# Patient Record
Sex: Female | Born: 1950 | Race: White | Hispanic: No | Marital: Married | Smoking: Never smoker
Health system: Southern US, Community
[De-identification: ages and names within clinical notes are randomized; demographics above are authoritative.]

## PROBLEM LIST (undated history)

## (undated) DIAGNOSIS — E785 Hyperlipidemia, unspecified: Secondary | ICD-10-CM

## (undated) DIAGNOSIS — I10 Essential (primary) hypertension: Secondary | ICD-10-CM

## (undated) DIAGNOSIS — G5603 Carpal tunnel syndrome, bilateral upper limbs: Secondary | ICD-10-CM

## (undated) HISTORY — DX: Essential (primary) hypertension: I10

## (undated) HISTORY — DX: Hyperlipidemia, unspecified: E78.5

## (undated) HISTORY — PX: BREAST BIOPSY: SHX20

## (undated) HISTORY — DX: Carpal tunnel syndrome, bilateral upper limbs: G56.03

---

## 1998-07-20 ENCOUNTER — Encounter: Admission: RE | Admit: 1998-07-20 | Discharge: 1998-07-20 | Payer: Self-pay | Admitting: Family Medicine

## 2000-06-04 ENCOUNTER — Encounter: Admission: RE | Admit: 2000-06-04 | Discharge: 2000-06-04 | Payer: Self-pay | Admitting: Family Medicine

## 2000-06-08 ENCOUNTER — Encounter: Admission: RE | Admit: 2000-06-08 | Discharge: 2000-06-08 | Payer: Self-pay | Admitting: Family Medicine

## 2000-06-08 ENCOUNTER — Encounter: Admission: RE | Admit: 2000-06-08 | Discharge: 2000-06-08 | Payer: Self-pay | Admitting: Sports Medicine

## 2000-06-08 ENCOUNTER — Encounter: Payer: Self-pay | Admitting: Family Medicine

## 2000-06-15 ENCOUNTER — Encounter: Admission: RE | Admit: 2000-06-15 | Discharge: 2000-06-15 | Payer: Self-pay | Admitting: Family Medicine

## 2003-08-08 ENCOUNTER — Other Ambulatory Visit: Admission: RE | Admit: 2003-08-08 | Discharge: 2003-08-08 | Payer: Self-pay | Admitting: Internal Medicine

## 2003-08-16 ENCOUNTER — Encounter: Admission: RE | Admit: 2003-08-16 | Discharge: 2003-08-16 | Payer: Self-pay | Admitting: Internal Medicine

## 2003-10-12 ENCOUNTER — Encounter: Admission: RE | Admit: 2003-10-12 | Discharge: 2003-10-12 | Payer: Self-pay | Admitting: Internal Medicine

## 2004-08-27 ENCOUNTER — Encounter: Admission: RE | Admit: 2004-08-27 | Discharge: 2004-08-27 | Payer: Self-pay | Admitting: Internal Medicine

## 2004-11-04 ENCOUNTER — Other Ambulatory Visit: Admission: RE | Admit: 2004-11-04 | Discharge: 2004-11-04 | Payer: Self-pay | Admitting: Internal Medicine

## 2005-09-09 ENCOUNTER — Encounter: Admission: RE | Admit: 2005-09-09 | Discharge: 2005-09-09 | Payer: Self-pay | Admitting: Internal Medicine

## 2005-12-16 ENCOUNTER — Other Ambulatory Visit: Admission: RE | Admit: 2005-12-16 | Discharge: 2005-12-16 | Payer: Self-pay | Admitting: Internal Medicine

## 2006-10-22 ENCOUNTER — Encounter: Admission: RE | Admit: 2006-10-22 | Discharge: 2006-10-22 | Payer: Self-pay | Admitting: Internal Medicine

## 2007-11-02 ENCOUNTER — Encounter: Admission: RE | Admit: 2007-11-02 | Discharge: 2007-11-02 | Payer: Self-pay | Admitting: Internal Medicine

## 2008-04-27 ENCOUNTER — Ambulatory Visit: Payer: Self-pay | Admitting: Internal Medicine

## 2008-04-27 ENCOUNTER — Other Ambulatory Visit: Admission: RE | Admit: 2008-04-27 | Discharge: 2008-04-27 | Payer: Self-pay | Admitting: Internal Medicine

## 2008-11-09 ENCOUNTER — Encounter: Admission: RE | Admit: 2008-11-09 | Discharge: 2008-11-09 | Payer: Self-pay | Admitting: Internal Medicine

## 2008-11-23 ENCOUNTER — Ambulatory Visit: Payer: Self-pay | Admitting: Internal Medicine

## 2009-05-22 ENCOUNTER — Ambulatory Visit: Payer: Self-pay | Admitting: Internal Medicine

## 2009-05-22 ENCOUNTER — Other Ambulatory Visit
Admission: RE | Admit: 2009-05-22 | Discharge: 2009-05-22 | Payer: Self-pay | Source: Home / Self Care | Admitting: Internal Medicine

## 2009-05-22 LAB — HM PAP SMEAR

## 2009-06-19 ENCOUNTER — Ambulatory Visit: Payer: Self-pay | Admitting: Sports Medicine

## 2009-06-19 DIAGNOSIS — G5601 Carpal tunnel syndrome, right upper limb: Secondary | ICD-10-CM | POA: Insufficient documentation

## 2009-06-19 DIAGNOSIS — M2020 Hallux rigidus, unspecified foot: Secondary | ICD-10-CM | POA: Insufficient documentation

## 2009-06-19 DIAGNOSIS — M79609 Pain in unspecified limb: Secondary | ICD-10-CM | POA: Insufficient documentation

## 2009-08-16 ENCOUNTER — Ambulatory Visit: Payer: Self-pay | Admitting: Internal Medicine

## 2009-11-14 ENCOUNTER — Encounter: Admission: RE | Admit: 2009-11-14 | Discharge: 2009-11-14 | Payer: Self-pay | Admitting: Internal Medicine

## 2009-11-20 ENCOUNTER — Ambulatory Visit: Payer: Self-pay | Admitting: Internal Medicine

## 2010-05-14 NOTE — Assessment & Plan Note (Signed)
Summary: 4PM APPT,FOOT INJURY PER Gwendolyn Herman,MC   Vital Signs:  Patient profile:   60 year old female Height:      62 inches Weight:      170 pounds BMI:     31.21 BP sitting:   155 / 92  Vitals Entered By: Lillia Pauls CMA (June 19, 2009 4:28 PM)  CC:  left foot pain, left great toe pain, and carpal tunnel.  History of Present Illness: 1.  left foot pain--after tripping over husband's shoe at the end of the stairs 2 days ago.  everted foot.  pain since that time.  some brusing that she just noticed today.  has been icing and elevating.  painful, but can bear weight.  pain is over dorsal surface of foot over metatarsals.\par  2.  left great toe pain--toe is stiff, painful, decreased rom.  no swelling or numbness  3.  bilateral carpal tunnel--has a splint for the right, but not for the left.    Review of Systems General:  Denies weakness. MS:  Denies joint redness, loss of strength, and muscle weakness.  Physical Exam  General:  Well-developed,well-nourished,in no acute distress; alert,appropriate and cooperative throughout examination Msk:  left foot:  moderate swelling and bruising dorsal foot over the metatarsals.  ttp over the 2nd metatarsal.  no bony abnormalities on palpation.  normal rom of ankle.  no laxity of ankle ligaments.    u/s of left dorsal foot:  2nd-5th metatarsals with no visible fracture.  increase blood flow.  images saved  left great toe:  flexion to about 30 degrees.  extension to 20 degrees.  no bony spurs palpated.   Impression & Recommendations:  Problem # 1:  FOOT PAIN (ICD-729.5) Assessment New forefoot strain.  no fx seen on u/s.  RICE, cast shoe, foot strap to prevent mobility.  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  given splint for  the right.  start vitamin B 6  Orders: Brace Wrist ([id number])  Patient Instructions: 1)  It was nice to see you today. 2)  Wear the foot strap and the shoe that we gave you for the next week. 3)  Keep icing your foot.  Elevate it when you can. 4)  If you are feeling better in one week, you can start wearing a regular shoe, but keep wearing the foot strap. 5)  It is OK to bike, but don't do a lot of walking until you are feeling better. 6)  For your toe, use the anti-inflammatory gel Dr Darrick Penna prescribed you. 7)  For your wrists, wear the splints at night and start taking Vitamin B 6. 8)  Please schedule a follow-up appointment if you are not getting better.

## 2010-05-21 ENCOUNTER — Other Ambulatory Visit: Payer: Self-pay | Admitting: Internal Medicine

## 2010-05-23 ENCOUNTER — Encounter (INDEPENDENT_AMBULATORY_CARE_PROVIDER_SITE_OTHER): Payer: BC Managed Care – PPO | Admitting: Internal Medicine

## 2010-05-23 DIAGNOSIS — E785 Hyperlipidemia, unspecified: Secondary | ICD-10-CM

## 2010-05-23 DIAGNOSIS — I10 Essential (primary) hypertension: Secondary | ICD-10-CM

## 2010-09-25 ENCOUNTER — Other Ambulatory Visit: Payer: Self-pay

## 2010-09-25 MED ORDER — ATORVASTATIN CALCIUM 40 MG PO TABS: 40.0000 mg | ORAL_TABLET | Freq: Every day | ORAL | Status: DC

## 2010-10-23 ENCOUNTER — Other Ambulatory Visit: Payer: Self-pay | Admitting: Internal Medicine

## 2010-10-23 DIAGNOSIS — Z1231 Encounter for screening mammogram for malignant neoplasm of breast: Secondary | ICD-10-CM

## 2010-11-11 ENCOUNTER — Telehealth: Payer: Self-pay

## 2010-11-12 NOTE — Telephone Encounter (Signed)
Patient needed to change an appointment

## 2010-11-18 ENCOUNTER — Encounter: Payer: Self-pay | Admitting: Internal Medicine

## 2010-11-18 ENCOUNTER — Ambulatory Visit
Admission: RE | Admit: 2010-11-18 | Discharge: 2010-11-18 | Disposition: A | Discharge status: Home / Self Care | Payer: BC Managed Care – PPO | Source: Ambulatory Visit | Attending: Internal Medicine | Admitting: Internal Medicine

## 2010-11-18 DIAGNOSIS — Z1231 Encounter for screening mammogram for malignant neoplasm of breast: Secondary | ICD-10-CM

## 2010-11-19 ENCOUNTER — Other Ambulatory Visit: Payer: BC Managed Care – PPO | Admitting: Internal Medicine

## 2010-11-19 DIAGNOSIS — E785 Hyperlipidemia, unspecified: Secondary | ICD-10-CM

## 2010-11-19 LAB — LIPID PANEL
HDL: 75 mg/dL (ref >39)
Triglycerides: 82 mg/dL (ref <150)

## 2010-11-19 LAB — HEPATIC FUNCTION PANEL
ALT: 22 U/L (ref 0–35)
Albumin: 4.1 g/dL (ref 3.5–5.2)
Alkaline Phosphatase: 59 U/L (ref 39–117)
Total Bilirubin: 0.7 mg/dL (ref 0.3–1.2)

## 2010-11-21 ENCOUNTER — Ambulatory Visit (INDEPENDENT_AMBULATORY_CARE_PROVIDER_SITE_OTHER): Payer: BC Managed Care – PPO | Admitting: Internal Medicine

## 2010-11-21 ENCOUNTER — Encounter: Payer: Self-pay | Admitting: Internal Medicine

## 2010-11-21 ENCOUNTER — Other Ambulatory Visit: Payer: BC Managed Care – PPO | Admitting: Internal Medicine

## 2010-11-21 VITALS — BP 132/78 | HR 72 | Temp 98.7°F | Ht 62.0 in | Wt 159.0 lb

## 2010-11-21 DIAGNOSIS — E785 Hyperlipidemia, unspecified: Secondary | ICD-10-CM | POA: Insufficient documentation

## 2010-11-21 DIAGNOSIS — I10 Essential (primary) hypertension: Secondary | ICD-10-CM | POA: Insufficient documentation

## 2010-11-21 NOTE — Progress Notes (Signed)
  Subjective:    Patient ID: Gwendolyn Herman, female    DOB: December 05, 1950, 60 y.o.   MRN: [id number]  HPI  60 year old white female with history of hypertension and hyperlipidemia in today for six-month recheck. She is on Cozaar 50 mg daily and Lipitor 40 mg daily. She's been walking at least 4 miles daily. In February she weighed 176 pounds and now weighs 159 pounds. Had mammogram recently which was normal.    Review of Systems     Objective:   Physical Exam  chest clear to auscultation; cardiac exam regular rate and rhythm, normal S1 and S2; extremities without edema.        Assessment & Plan:  Hypertension  Hyperlipidemia-recent lipid panel liver functions entirely within normal limits on Lipitor 40 mg daily  Return in 6 months for physical examination.

## 2011-03-17 ENCOUNTER — Encounter: Payer: Self-pay | Admitting: Internal Medicine

## 2011-03-17 ENCOUNTER — Ambulatory Visit (INDEPENDENT_AMBULATORY_CARE_PROVIDER_SITE_OTHER): Payer: BC Managed Care – PPO | Admitting: Internal Medicine

## 2011-03-17 VITALS — BP 152/100 | HR 88 | Temp 99.8°F | Wt 169.0 lb

## 2011-03-17 DIAGNOSIS — I10 Essential (primary) hypertension: Secondary | ICD-10-CM

## 2011-03-17 DIAGNOSIS — H6500 Acute serous otitis media, unspecified ear: Secondary | ICD-10-CM

## 2011-03-17 DIAGNOSIS — A088 Other specified intestinal infections: Secondary | ICD-10-CM

## 2011-03-17 DIAGNOSIS — A084 Viral intestinal infection, unspecified: Secondary | ICD-10-CM

## 2011-03-17 DIAGNOSIS — J029 Acute pharyngitis, unspecified: Secondary | ICD-10-CM

## 2011-03-17 DIAGNOSIS — H6503 Acute serous otitis media, bilateral: Secondary | ICD-10-CM

## 2011-03-17 DIAGNOSIS — E785 Hyperlipidemia, unspecified: Secondary | ICD-10-CM

## 2011-03-17 LAB — POCT RAPID STREP A (OFFICE): Rapid Strep A Screen: NEGATIVE

## 2011-03-17 NOTE — Patient Instructions (Addendum)
Take Zithromax Z-Pak 2 tablets by mouth day one followed by 1 tablet by mouth days 2 through 5. May take Hycodan 1 teaspoon by mouth every 6 hours when necessary cough. Tickly take cough syrup at night so you can sleep. May take Sudafed PE over-the-counter in the morning for postnasal drip and congestion. Call if not better in one week or sooner if worse

## 2011-03-17 NOTE — Progress Notes (Signed)
  Subjective:    Patient ID: Gwendolyn Herman, female    DOB: Jun 25, 1950, 60 y.o.   MRN: [id number]  HPI patient has been traveling by airplane recently. She went to [place] before Thanksgiving and subsequently flew to [place] with her son and husband for Thanksgiving. Patient came down with a scratchy throat before leaving to go to [place]. Her flight was delayed several hours. When she got [place], she did not feel well. However her son became ill with gastroenteritis. She had no fever or chills. Mainly has scratchy throat and slight cough. She subsequently developed nausea and vomited 3 or 4 times. Her son had diarrhea but she never developed diarrhea. Husband did not get sore throat or gastroenteritis. Patient continues to have scratchy throat. History of hypertension and hyperlipidemia.    Review of Systems     Objective:   Physical Exam HEENT exam: Pharynx is only slightly injected without exudate. TMs are clear but slightly full; neck is supple without significant adenopathy; chest clear to auscultation        Assessment & Plan:  Exam the patient had pharyngitis symptoms before leaving for [place]. Her son came down with acute gastroenteritis while there and it seemed that she may also have had gastroenteritis symptoms as well. I suspect this was viral gastroenteritis perhaps Norovirus.  Plan: Will treat pharyngitis and serous otitis media with Zithromax Z-PAK take 2 tablets by mouth day one followed by 1 tablet by mouth days 2 through 5. May take Sudafed PE for postnasal drip. Hycodan 8 ounces 1 teaspoon by mouth every 6 hours when necessary cough.

## 2011-05-27 ENCOUNTER — Other Ambulatory Visit: Payer: BC Managed Care – PPO | Admitting: Internal Medicine

## 2011-05-27 DIAGNOSIS — Z0000 Encounter for general adult medical examination without abnormal findings: Secondary | ICD-10-CM

## 2011-05-27 LAB — COMPREHENSIVE METABOLIC PANEL
AST: 34 U/L (ref 0–37)
Alkaline Phosphatase: 69 U/L (ref 39–117)
BUN: 19 mg/dL (ref 6–23)
Calcium: 9.5 mg/dL (ref 8.4–10.5)
Chloride: 105 mEq/L (ref 96–112)
Creat: 0.7 mg/dL (ref 0.50–1.10)
Glucose, Bld: 110 mg/dL — ABNORMAL HIGH (ref 70–99)

## 2011-05-27 LAB — LIPID PANEL
HDL: 52 mg/dL (ref >39)
LDL Cholesterol: 134 mg/dL — ABNORMAL HIGH (ref 0–99)
Total CHOL/HDL Ratio: 4.6 Ratio
Triglycerides: 266 mg/dL — ABNORMAL HIGH (ref <150)

## 2011-05-27 LAB — CBC WITH DIFFERENTIAL/PLATELET
Basophils Relative: 0 % (ref 0–1)
Hemoglobin: 13.7 g/dL (ref 12.0–15.0)
Lymphs Abs: 2.4 10*3/uL (ref 0.7–4.0)
MCH: 31 pg (ref 26.0–34.0)
MCV: 93 fL (ref 78.0–100.0)
Monocytes Absolute: 0.4 10*3/uL (ref 0.1–1.0)
Monocytes Relative: 8 % (ref 3–12)
Neutrophils Relative %: 39 % — ABNORMAL LOW (ref 43–77)
RBC: 4.42 MIL/uL (ref 3.87–5.11)
RDW: 12.7 % (ref 11.5–15.5)

## 2011-05-27 LAB — TSH: TSH: 0.971 u[IU]/mL (ref 0.350–4.500)

## 2011-05-28 LAB — VITAMIN D 25 HYDROXY (VIT D DEFICIENCY, FRACTURES): Vit D, 25-Hydroxy: 41 ng/mL (ref 30–89)

## 2011-05-29 ENCOUNTER — Encounter: Payer: Self-pay | Admitting: Internal Medicine

## 2011-05-29 ENCOUNTER — Ambulatory Visit (INDEPENDENT_AMBULATORY_CARE_PROVIDER_SITE_OTHER): Payer: BC Managed Care – PPO | Admitting: Internal Medicine

## 2011-05-29 VITALS — BP 150/100 | HR 76 | Temp 99.2°F | Ht 62.5 in | Wt 177.0 lb

## 2011-05-29 DIAGNOSIS — I10 Essential (primary) hypertension: Secondary | ICD-10-CM

## 2011-05-29 LAB — POCT URINALYSIS DIPSTICK
Blood, UA: NEGATIVE
Glucose, UA: NEGATIVE
Leukocytes, UA: NEGATIVE
Protein, UA: NEGATIVE
Spec Grav, UA: 1.01
pH, UA: 5

## 2011-06-11 ENCOUNTER — Ambulatory Visit (INDEPENDENT_AMBULATORY_CARE_PROVIDER_SITE_OTHER): Payer: BC Managed Care – PPO | Admitting: Sports Medicine

## 2011-06-11 DIAGNOSIS — G5600 Carpal tunnel syndrome, unspecified upper limb: Secondary | ICD-10-CM

## 2011-06-11 DIAGNOSIS — M79609 Pain in unspecified limb: Secondary | ICD-10-CM

## 2011-06-11 MED ORDER — MELOXICAM 15 MG PO TABS: ORAL_TABLET | ORAL | Status: AC

## 2011-06-11 NOTE — Assessment & Plan Note (Addendum)
SI with 1st MT ray post on right. Lateral post on left. PF rehab. Meloxicam. RTC 4-6 weeks to reassess. Custom orthotics if better.

## 2011-06-11 NOTE — Assessment & Plan Note (Addendum)
R>>L. Cont night splints. Meloxicam. Median nerve hydrodissection. Rehab exercises.

## 2011-06-11 NOTE — Progress Notes (Signed)
  Subjective:    Patient ID: Gwendolyn Herman, female    DOB: [birth year], 61 y.o.   MRN: [id number]  HPI Gwendolyn Herman is a 4 mile and a walker, who comes in after a long absence with multiple complaints.  Right foot: Painful at the first MTP over her bunion. This is been present for years, and she feels it's worse with tight shoes. She's not had any treatment for this. She also complains about pain at the heel. This is worse in the first few steps in the morning. No treatment yet for this.  Left foot: She notes some numbness in her lateral 3 toes after walking for 45 minutes. No pain coming from her back, or extending proximal to her ankle. No change in gait, or walking surface.  Carpal tunnel syndrome: Bilateral, worse on the right side. Numbness particularly at night, she has been wearing a night splint. No weakness. She does desire an injection today.    Review of Systems    No fevers, chills, night sweats, weight loss, chest pain, or shortness of breath.  Social History: Non-smoker. Objective:   Physical Exam General:  Well developed, well nourished, and in no acute distress. Neuro:  Alert and oriented x3, extra-ocular muscles intact. Skin: Warm and dry, no rashes noted. Respiratory:  Not using accessory muscles, speaking in full sentences. Musculoskeletal: Right Wrist: Inspection normal with no visible erythema or swelling. ROM smooth and normal with good flexion and extension and ulnar/radial deviation that is symmetrical with opposite wrist. Palpation is normal over metacarpals, navicular, lunate, and TFCC; tendons without tenderness/ swelling No snuffbox tenderness. No tenderness over Canal of Guyon. Strength 5/5 in all directions without pain. Negative Finkelstein. Positive Tinel, positive Phalen. Positive median nerve compression. No thenar atrophy. Negative Watson's test.  Foot inspection and palpation reveals no breakdown of the transverse arch or drop of MT heads. Abnormal  callous is present: No Hammer toes: No TTP: No Hallux valgus deformity of great toe and first MT: Present bilaterally, hallux rigidus on the right side. Normal motion of the first ray on the left side.  Leg lengths equal, hip abductor strength good bilaterally. Gait neutral.  Real-time Ultrasound Guided Injection of: Right carpal tunnel median nerve hydrodissection. Ultrasound guided injection is preferred based studies that show increased duration, increased effect, greater accuracy, decreased procedural pain, increased response rate, and decreased cost with ultrasound guided versus blind injection. Consent obtained. Time-out conducted. Noted no overlying erythema, induration, or other signs of local infection. Skin prepped in a sterile fashion. Local anesthesia: Topical ethyl chloride With sterile technique and under real time ultrasound guidance: Needle inserted from an ulnar approach under real time ultrasound guidance into the carpal tunnel, medication injected superficial to, as well as deep to the median nerve hydrodissecting it away from other structures in the carpal tunnel. 1 cc Depo-Medrol 10, 1-1/2 cc lidocaine used. Median nerve was increased in size, at 1.7 cm. Completed without difficulty Pain immediately resolved suggesting accurate placement of the medication. Advised to call if fevers/chills, erythema, induration, drainage, or persistent bleeding. Images saved.  Sports insoles with first MT ray posting on the right side, and lateral post on the left side used. These were comfortable.     Assessment & Plan:

## 2011-06-12 ENCOUNTER — Other Ambulatory Visit: Payer: Self-pay

## 2011-06-12 MED ORDER — ATORVASTATIN CALCIUM 40 MG PO TABS: 40.0000 mg | ORAL_TABLET | Freq: Every day | ORAL | Status: DC

## 2011-06-20 ENCOUNTER — Ambulatory Visit: Payer: BC Managed Care – PPO | Admitting: Internal Medicine

## 2011-06-24 ENCOUNTER — Encounter: Payer: Self-pay | Admitting: Internal Medicine

## 2011-06-24 ENCOUNTER — Ambulatory Visit (INDEPENDENT_AMBULATORY_CARE_PROVIDER_SITE_OTHER): Payer: BC Managed Care – PPO | Admitting: Internal Medicine

## 2011-06-24 VITALS — BP 136/94 | HR 64 | Temp 98.7°F | Wt 177.0 lb

## 2011-06-24 DIAGNOSIS — I10 Essential (primary) hypertension: Secondary | ICD-10-CM

## 2011-06-24 DIAGNOSIS — E669 Obesity, unspecified: Secondary | ICD-10-CM

## 2011-06-24 DIAGNOSIS — E785 Hyperlipidemia, unspecified: Secondary | ICD-10-CM

## 2011-06-24 LAB — BASIC METABOLIC PANEL
CO2: 24 mEq/L (ref 19–32)
Potassium: 4.8 mEq/L (ref 3.5–5.3)

## 2011-07-12 DIAGNOSIS — E669 Obesity, unspecified: Secondary | ICD-10-CM | POA: Insufficient documentation

## 2011-07-12 NOTE — Patient Instructions (Signed)
Continue same medications. Return in 3-6 months.

## 2011-07-12 NOTE — Progress Notes (Signed)
  Subjective:    Patient ID: Gwendolyn Herman, female    DOB: 01/16/1951, 61 y.o.   MRN: [id number]  HPI 61 year old white female in today for recheck of hypertension. Blood pressure is still somewhat elevated. When I rechecked it I got 130/90. She does try to exercise on a regular basis. It seems she is unable to lose weight . This is long-standing issue and may well be genetic. Patient does admit to overindulging in food on occasion. She is busy. Has a number of upcoming trips. At last visit Cozaar was increased from 50-100 mg daily.    Review of Systems     Objective:   Physical Exam neck supple without JVD, thyromegaly or carotid bruits; chest clear to auscultation; cardiac exam regular rate and rhythm; extremities without edema        Assessment & Plan:  Hypertension  Obesity  Hyperlipidemia  Plan: Option is to continue with Cozaar 100 mg daily or to add HCTZ to Cozaar (Hyzaar 100/25) daily.

## 2011-07-14 ENCOUNTER — Other Ambulatory Visit: Payer: Self-pay

## 2011-07-14 NOTE — Patient Instructions (Signed)
Increase Cozaar to 100 mg daily and return in 3 weeks

## 2011-07-14 NOTE — Progress Notes (Signed)
  Subjective:    Patient ID: Gwendolyn Herman, female    DOB: 1950-05-02, 61 y.o.   MRN: [id number]  HPI pleasant 61 year old white female for evaluation of medical problems and health maintenance. History of fractured right foot 1972, history of hyperlipidemia, history of bilateral carpal tunnel syndrome, history of obesity, history of hypertension. Has occasional anxiety for which she takes Xanax 0.25 mg as needed.  Family history: Father died in 2001-09-29 in his sleep at age 13. Mother died in 55 with history of congestive heart failure. One sister with history of hypertension and hyperlipidemia.  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 Husband operates the [employer]. Patient does not smoke. Social alcohol consumption. She exercises regularly.  Last Pap smear 05/22/2009. Patient has a history of systolic murmur. Had 2-D echocardiogram 09-30-2003 which was normal. She started on Cozaar 2011-06-18was on Zocor for while and then switch to Lipitor August 2011. Has not had colonoscopy.    Review of Systems noncontributory except per history of present illness     Objective:   Physical Exam HEENT exam: TMs and pharynx are clear, neck is supple without JVD thyromegaly or carotid bruits, chest clear to auscultation, cardiac exam regular rate and rhythm normal S1 and S2 with one over 6 systolic ejection murmur previously evaluated by 2-D echocardiogram and found to be normal, breasts normal female without masses, abdomen is benign without masses or tenderness or organomegaly. Extremities without pitting edema. Neuro no focal deficits.        Assessment & Plan:  Hypertension  Obesity  History of bilateral carpal tunnel syndrome  Hyperlipidemia  Plan: Increase Cozaar to 100 mg daily and return in 3 weeks

## 2011-07-23 ENCOUNTER — Ambulatory Visit: Payer: BC Managed Care – PPO | Admitting: Sports Medicine

## 2011-09-16 ENCOUNTER — Other Ambulatory Visit: Payer: BC Managed Care – PPO | Admitting: Internal Medicine

## 2011-09-16 DIAGNOSIS — Z79899 Other long term (current) drug therapy: Secondary | ICD-10-CM

## 2011-09-16 DIAGNOSIS — E785 Hyperlipidemia, unspecified: Secondary | ICD-10-CM

## 2011-09-16 LAB — LIPID PANEL
LDL Cholesterol: 101 mg/dL — ABNORMAL HIGH (ref 0–99)
Total CHOL/HDL Ratio: 3 Ratio
Triglycerides: 63 mg/dL (ref <150)

## 2011-09-16 LAB — HEPATIC FUNCTION PANEL
AST: 31 U/L (ref 0–37)
Indirect Bilirubin: 0.7 mg/dL (ref 0.0–0.9)
Total Bilirubin: 0.9 mg/dL (ref 0.3–1.2)

## 2011-09-18 ENCOUNTER — Ambulatory Visit (INDEPENDENT_AMBULATORY_CARE_PROVIDER_SITE_OTHER): Payer: BC Managed Care – PPO | Admitting: Internal Medicine

## 2011-09-18 ENCOUNTER — Encounter: Payer: Self-pay | Admitting: Internal Medicine

## 2011-09-18 VITALS — BP 126/90 | HR 80 | Temp 98.2°F | Wt 170.0 lb

## 2011-09-18 DIAGNOSIS — E785 Hyperlipidemia, unspecified: Secondary | ICD-10-CM

## 2011-09-18 DIAGNOSIS — I10 Essential (primary) hypertension: Secondary | ICD-10-CM

## 2011-09-18 DIAGNOSIS — E669 Obesity, unspecified: Secondary | ICD-10-CM

## 2011-12-02 ENCOUNTER — Other Ambulatory Visit: Payer: Self-pay | Admitting: Internal Medicine

## 2011-12-02 DIAGNOSIS — Z1231 Encounter for screening mammogram for malignant neoplasm of breast: Secondary | ICD-10-CM

## 2011-12-11 ENCOUNTER — Other Ambulatory Visit: Payer: Self-pay

## 2011-12-11 MED ORDER — LOSARTAN POTASSIUM 100 MG PO TABS: 100.0000 mg | ORAL_TABLET | Freq: Every day | ORAL | Status: DC

## 2011-12-14 ENCOUNTER — Encounter: Payer: Self-pay | Admitting: Internal Medicine

## 2011-12-14 NOTE — Progress Notes (Signed)
  Subjective:    Patient ID: Gwendolyn Herman, female    DOB: [birth year], 61 y.o.   MRN: [id number]  HPI 61 year old white female with history of hypertension and hyperlipidemia in today for followup of these issues. Cholesterol has improved immensely from a total of 239-172. Triglycerides improved from 266-63. This with diet and exercise in addition to statin medication. I am very pleased with her progress with regard to lipid control. Blood pressure is stable on Cozaar 100 mg daily. We're going to continue with that for now and on a diuretic. She's had a very busy summer and is expecting a busy fall with much travel.    Review of Systems     Objective:   Physical Exam Neck is supple without JVD, thyromegaly or carotid bruits. Chest clear to auscultation. Cardiac exam regular rate and rhythm normal S1 and S2; Extremities without edema        Assessment & Plan:  Hypertension-stable on Cozaar 100 mg daily  Hyperlipidemia-markedly improved with diet and exercise and statin medication  Plan: Return in 6 months for physical exam

## 2011-12-14 NOTE — Patient Instructions (Addendum)
Continue same medications and return in 6 months 

## 2011-12-18 ENCOUNTER — Ambulatory Visit
Admission: RE | Admit: 2011-12-18 | Discharge: 2011-12-18 | Disposition: A | Discharge status: Home / Self Care | Payer: BC Managed Care – PPO | Source: Ambulatory Visit | Attending: Internal Medicine | Admitting: Internal Medicine

## 2011-12-18 DIAGNOSIS — Z1231 Encounter for screening mammogram for malignant neoplasm of breast: Secondary | ICD-10-CM

## 2012-02-28 ENCOUNTER — Other Ambulatory Visit: Payer: Self-pay | Admitting: Internal Medicine

## 2012-03-16 ENCOUNTER — Other Ambulatory Visit: Payer: BC Managed Care – PPO | Admitting: Internal Medicine

## 2012-03-16 DIAGNOSIS — E785 Hyperlipidemia, unspecified: Secondary | ICD-10-CM

## 2012-03-16 DIAGNOSIS — Z79899 Other long term (current) drug therapy: Secondary | ICD-10-CM

## 2012-03-17 LAB — HEPATIC FUNCTION PANEL
ALT: 62 U/L — ABNORMAL HIGH (ref 0–35)
Alkaline Phosphatase: 74 U/L (ref 39–117)
Indirect Bilirubin: 0.3 mg/dL (ref 0.0–0.9)
Total Bilirubin: 0.4 mg/dL (ref 0.3–1.2)

## 2012-03-17 LAB — LIPID PANEL
Cholesterol: 253 mg/dL — ABNORMAL HIGH (ref 0–200)
HDL: 63 mg/dL (ref >39)
LDL Cholesterol: 151 mg/dL — ABNORMAL HIGH (ref 0–99)
Total CHOL/HDL Ratio: 4 Ratio
VLDL: 39 mg/dL (ref 0–40)

## 2012-03-21 ENCOUNTER — Other Ambulatory Visit: Payer: Self-pay | Admitting: Internal Medicine

## 2012-05-05 ENCOUNTER — Encounter: Payer: Self-pay | Admitting: Sports Medicine

## 2012-05-05 ENCOUNTER — Ambulatory Visit (INDEPENDENT_AMBULATORY_CARE_PROVIDER_SITE_OTHER): Payer: BC Managed Care – PPO | Admitting: Sports Medicine

## 2012-05-05 VITALS — BP 146/90 | HR 73

## 2012-05-05 DIAGNOSIS — I10 Essential (primary) hypertension: Secondary | ICD-10-CM

## 2012-05-05 DIAGNOSIS — M2020 Hallux rigidus, unspecified foot: Secondary | ICD-10-CM

## 2012-05-05 DIAGNOSIS — G5600 Carpal tunnel syndrome, unspecified upper limb: Secondary | ICD-10-CM

## 2012-05-05 DIAGNOSIS — G5601 Carpal tunnel syndrome, right upper limb: Secondary | ICD-10-CM

## 2012-05-05 MED ORDER — LOSARTAN POTASSIUM-HCTZ 100-12.5 MG PO TABS: 1.0000 | ORAL_TABLET | Freq: Every day | ORAL | Status: DC

## 2012-05-05 MED ORDER — TRAMADOL HCL 50 MG PO TABS: 50.0000 mg | ORAL_TABLET | Freq: Two times a day (BID) | ORAL | Status: DC | PRN

## 2012-05-05 NOTE — Assessment & Plan Note (Signed)
Ultrasound again shows enlargement of the median nerve at 0.19 cm There is also a beak sign with proximal dilation of the nerve and distal tapering  Corticosteroid injection today Handgrip exercises  Resume use of night splint  Recheck 2 months

## 2012-05-05 NOTE — Assessment & Plan Note (Signed)
She is a good candidate for custom orthotics Early bunion formation is becoming more painful  She will first start by trying to use more supportive walking shoes  When she returns in 2 months we consider custom orthotics

## 2012-05-05 NOTE — Assessment & Plan Note (Addendum)
Went ahead and switched her Cozaar to include 12.5 mg of hydrochlorothiazide  Her pressures are elevated in our office and with the carpal tunnel on hoping this will lessen her symptoms as well as improve her blood pressure control  Considering her elevated cholesterol on reviewed and her mild elevation of blood pressure I think she should return to her primary care physician Dr. Lenord Fellers to see if any other changes are warranted. I would suggest getting a TSH again after February.  This was normal last year.

## 2012-05-05 NOTE — Patient Instructions (Addendum)
Carpal tunnel  Use soft ball in warm water and do gripping  Try vitamin b6 50 twice daily  We need to add a small dose of diuretic to your BP medicine  Injection may help but I would continue the night splints  Feet  Bunions are probably worse if you walk in minimalist shoes  Buy comfortable running or walking shoe (Nike Zoom Vomero )  You are a good candidate for a custom orthotic  Stop taking aleve and ibuprofen  rescan the carpal tunnel in 2 months  See if you have tramadol at home

## 2012-05-05 NOTE — Progress Notes (Addendum)
Patient ID: Gwendolyn Herman, female   DOB: 11-27-1950, 62 y.o.   MRN: [id number]  Last thyroid check was 5 to 6 years ago Has carpal tunnel Shot in February gave her 10 mos of relief Now waking her up at night She gets pain and tingling at night and gets very numb with throbbing  Weakness with opening jars  Also has pain with walking and minimal shoes that affects her right great toe   Patient also notice her hands tend to swell at night. She does have hypertension and is not taking a diuretic.  Examination  RT hand with + augmented Phalen's  Grip strength OK   Rt index 20  3rd 22  4th 15  Lt index 22  3rd 20  4th 18   RT foot Hallux rigidus and early bunion formation  Bunionette on 5th No abnl callus on plantar  LT foot Mild valgus shift of great toe Mild bunionette  Real-time Ultrasound Guided Injection of: Right carpal tunnel median nerve hydrodissection.   Consent obtained.  Time-out conducted.  Noted no overlying erythema, induration, or other signs of local infection.  Skin prepped in a sterile fashion.  Local anesthesia: Topical ethyl chloride and 1 cc of 1% lidocaine With sterile technique and under real time ultrasound guidance: Needle inserted from an ulnar approach under real time ultrasound guidance into the carpal tunnel, medication injected superficial to, as well as deep to the median nerve hydrodissecting it away from other structures in the carpal tunnel. 1 cc Depo-Medrol 10, 1-1/2 cc lidocaine used. Median nerve was increased in size, at .181 cm.  Completed without difficulty  Pain immediately resolved suggesting accurate placement of the medication.  Advised to call if fevers/chills, erythema, induration, drainage, or persistent bleeding.  Images saved.

## 2012-06-24 ENCOUNTER — Ambulatory Visit (INDEPENDENT_AMBULATORY_CARE_PROVIDER_SITE_OTHER): Payer: BC Managed Care – PPO | Admitting: Sports Medicine

## 2012-06-24 VITALS — BP 128/80 | Ht 62.0 in | Wt 170.0 lb

## 2012-06-24 DIAGNOSIS — M2021 Hallux rigidus, right foot: Secondary | ICD-10-CM

## 2012-06-24 DIAGNOSIS — G5600 Carpal tunnel syndrome, unspecified upper limb: Secondary | ICD-10-CM

## 2012-06-24 DIAGNOSIS — M2020 Hallux rigidus, unspecified foot: Secondary | ICD-10-CM

## 2012-06-24 DIAGNOSIS — G5601 Carpal tunnel syndrome, right upper limb: Secondary | ICD-10-CM

## 2012-06-24 NOTE — Assessment & Plan Note (Signed)
Improvement in sxs  Not sure how much of this was CSI and how much is medication change  Needs to keep up night splint

## 2012-06-24 NOTE — Assessment & Plan Note (Signed)
We repadded new insoles  Placed a larger first ray post to cushion the 1st MTP on RT and lessen pressure on bunion  Use these when possible  We will replace as needed  Prn reck

## 2012-06-24 NOTE — Progress Notes (Signed)
  Subjective:    Patient ID: Gwendolyn Herman, female    DOB: December 31, 1950, 62 y.o.   MRN: [id number]  HPI  Pt presents to clinic for f/u of rt great toe hallux rigidus with bunion. States her foot pain is about the same. Tramadol helps, takes as needed.  Rt hand is feeling much better, not waking her up at night now. Had injection at last visit, taking vitamin B6.   She did not get immediate relief from CSI Addition of HCTZ and B6 may be making a difference  Not always using night splint   Review of Systems     Objective:   Physical Exam  NAD  Bunion on RT Very limited motion of RT MTP Left MTP 1 moves ok Loss of longitudinal arch bilat  Wrist motion is good No pain with movement       Assessment & Plan:

## 2012-06-29 ENCOUNTER — Other Ambulatory Visit: Payer: Self-pay | Admitting: Sports Medicine

## 2012-07-01 ENCOUNTER — Ambulatory Visit: Payer: BC Managed Care – PPO | Admitting: Sports Medicine

## 2012-07-29 ENCOUNTER — Other Ambulatory Visit: Payer: Self-pay | Admitting: Sports Medicine

## 2012-08-21 ENCOUNTER — Other Ambulatory Visit: Payer: Self-pay | Admitting: Internal Medicine

## 2012-08-28 ENCOUNTER — Other Ambulatory Visit: Payer: Self-pay | Admitting: Sports Medicine

## 2012-08-29 ENCOUNTER — Other Ambulatory Visit: Payer: Self-pay | Admitting: Internal Medicine

## 2012-09-19 ENCOUNTER — Other Ambulatory Visit: Payer: Self-pay | Admitting: Internal Medicine

## 2012-10-19 ENCOUNTER — Other Ambulatory Visit: Payer: BC Managed Care – PPO | Admitting: Internal Medicine

## 2012-10-19 DIAGNOSIS — E785 Hyperlipidemia, unspecified: Secondary | ICD-10-CM

## 2012-10-19 DIAGNOSIS — I10 Essential (primary) hypertension: Secondary | ICD-10-CM

## 2012-10-19 DIAGNOSIS — Z1329 Encounter for screening for other suspected endocrine disorder: Secondary | ICD-10-CM

## 2012-10-19 DIAGNOSIS — Z130 Encounter for screening for diseases of the blood and blood-forming organs and certain disorders involving the immune mechanism: Secondary | ICD-10-CM

## 2012-10-19 DIAGNOSIS — Z13228 Encounter for screening for other metabolic disorders: Secondary | ICD-10-CM

## 2012-10-19 LAB — LIPID PANEL
HDL: 49 mg/dL (ref >39)
LDL Cholesterol: 117 mg/dL — ABNORMAL HIGH (ref 0–99)
Triglycerides: 128 mg/dL (ref <150)

## 2012-10-19 LAB — COMPREHENSIVE METABOLIC PANEL
AST: 34 U/L (ref 0–37)
Albumin: 4.2 g/dL (ref 3.5–5.2)
Alkaline Phosphatase: 69 U/L (ref 39–117)
CO2: 22 mEq/L (ref 19–32)
Calcium: 9.2 mg/dL (ref 8.4–10.5)
Creat: 0.77 mg/dL (ref 0.50–1.10)
Glucose, Bld: 116 mg/dL — ABNORMAL HIGH (ref 70–99)

## 2012-10-20 LAB — CBC WITH DIFFERENTIAL/PLATELET
Basophils Absolute: 0 10*3/uL (ref 0.0–0.1)
Eosinophils Relative: 3 % (ref 0–5)
Hemoglobin: 13.5 g/dL (ref 12.0–15.0)
MCH: 30.3 pg (ref 26.0–34.0)
MCV: 89 fL (ref 78.0–100.0)
Monocytes Relative: 10 % (ref 3–12)
Neutro Abs: 2.5 10*3/uL (ref 1.7–7.7)
Neutrophils Relative %: 53 % (ref 43–77)
Platelets: 232 10*3/uL (ref 150–400)
RBC: 4.46 MIL/uL (ref 3.87–5.11)
WBC: 5 10*3/uL (ref 4.0–10.5)

## 2012-10-21 ENCOUNTER — Other Ambulatory Visit (HOSPITAL_COMMUNITY)
Admission: RE | Admit: 2012-10-21 | Discharge: 2012-10-21 | Disposition: A | Discharge status: Home / Self Care | Payer: BC Managed Care – PPO | Source: Ambulatory Visit | Attending: Internal Medicine | Admitting: Internal Medicine

## 2012-10-21 ENCOUNTER — Ambulatory Visit (INDEPENDENT_AMBULATORY_CARE_PROVIDER_SITE_OTHER): Payer: BC Managed Care – PPO | Admitting: Internal Medicine

## 2012-10-21 ENCOUNTER — Encounter: Payer: Self-pay | Admitting: Internal Medicine

## 2012-10-21 VITALS — BP 144/92 | HR 80 | Ht 62.75 in | Wt 179.0 lb

## 2012-10-21 DIAGNOSIS — Z0000 Encounter for general adult medical examination without abnormal findings: Secondary | ICD-10-CM

## 2012-10-21 DIAGNOSIS — F411 Generalized anxiety disorder: Secondary | ICD-10-CM

## 2012-10-21 DIAGNOSIS — E785 Hyperlipidemia, unspecified: Secondary | ICD-10-CM

## 2012-10-21 DIAGNOSIS — H6693 Otitis media, unspecified, bilateral: Secondary | ICD-10-CM

## 2012-10-21 DIAGNOSIS — J209 Acute bronchitis, unspecified: Secondary | ICD-10-CM

## 2012-10-21 DIAGNOSIS — E669 Obesity, unspecified: Secondary | ICD-10-CM

## 2012-10-21 DIAGNOSIS — H6690 Otitis media, unspecified, unspecified ear: Secondary | ICD-10-CM

## 2012-10-21 DIAGNOSIS — Z01419 Encounter for gynecological examination (general) (routine) without abnormal findings: Secondary | ICD-10-CM | POA: Insufficient documentation

## 2012-10-21 DIAGNOSIS — I10 Essential (primary) hypertension: Secondary | ICD-10-CM

## 2012-10-21 LAB — POCT URINALYSIS DIPSTICK
Glucose, UA: NEGATIVE
Ketones, UA: NEGATIVE
Spec Grav, UA: 1.02

## 2012-11-05 ENCOUNTER — Ambulatory Visit (INDEPENDENT_AMBULATORY_CARE_PROVIDER_SITE_OTHER): Payer: BC Managed Care – PPO | Admitting: Internal Medicine

## 2012-11-05 ENCOUNTER — Encounter: Payer: Self-pay | Admitting: Internal Medicine

## 2012-11-05 VITALS — BP 140/94 | HR 68 | Temp 98.0°F

## 2012-11-05 DIAGNOSIS — I10 Essential (primary) hypertension: Secondary | ICD-10-CM

## 2012-11-05 MED ORDER — ATORVASTATIN CALCIUM 40 MG PO TABS: ORAL_TABLET | ORAL | Status: DC

## 2012-11-05 MED ORDER — LOSARTAN POTASSIUM-HCTZ 100-25 MG PO TABS: 1.0000 | ORAL_TABLET | Freq: Every day | ORAL | Status: DC

## 2012-11-05 NOTE — Patient Instructions (Addendum)
Take Hyzaar 100/25 daily and return in 6 weeks.

## 2012-11-05 NOTE — Progress Notes (Signed)
  Subjective:    Patient ID: Gwendolyn Herman, female    DOB: 1950-07-21, 62 y.o.   MRN: [id number]  HPI   For follow up of HTN with Losartan 100 mg daily. BP is still not at goal. She is walking 4 miles  Several days a week. Recently treated for respiratory infection at last visit. Was extremely tired after trip to [place] where she got sick with respiratory infection. Has been on Hyzaar  in the past per Dr. Darrick Penna but has not taken that in some time. Hx of hyperlipidemia.    Review of Systems     Objective:   Physical Exam skin is warm and dry. Nodes none. Neck is supple without JVD thyromegaly or carotid bruits. Chest clear to auscultation. Cardiac exam regular rate and rhythm. Trace lower extremity edema        Assessment & Plan:  Hypertension-not at goal with losartan 100 mg daily  Plan: Add HCTZ 25 mg daily to losartan 100 mg daily (Hyzaar) and return in 6 weeks. She will continue to monitor her blood pressure at home.

## 2012-11-10 ENCOUNTER — Encounter: Payer: Self-pay | Admitting: Internal Medicine

## 2012-11-10 NOTE — Progress Notes (Signed)
Subjective:    Patient ID: Gwendolyn Herman, female    DOB: 1951-03-28, 62 y.o.   MRN: [id number]  HPI 62 year old White female in today for health maintenance exam and evaluation of medical problems. History of hypertension and hyperlipidemia. She is overweight. Recently returned from trip to [place]. During that time in [place] she became ill with a respiratory infection and had to fly home not feeling well. She has had cough and congestion. Ears are stopped up. She is to take losartan 100 mg daily with HCTZ 12.5 mg daily. I believe Dr. feels prescribed a 12.5 mg of HCTZ but several months ago she discontinued it. Blood pressure is elevated today. Should not feeling well though.   History of fractured right foot 1972, history of bilateral carpal tunnel syndrome, history of occasional anxiety for which she takes Xanax as needed. She has a history of a systolic murmur. Had 2-D echocardiogram in 13-Sep-2003 which was normal. She started on losartan in 2009/09/12. She was on Zocor for while and then was switched to Lipitor in August 2011.  She's not had colonoscopy and was reminded about that.  Family history: Father died in 2001/09/12 in his sleep at age 68. Mother died in 53 with history of congestive heart failure. One sister with history of hypertension and hyperlipidemia.  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 They travel quite a bit. She does not smoke. Social alcohol consumption. She exercises regularly. Has adult children.    Review of Systems  Constitutional: Positive for chills and fatigue.  HENT: Positive for ear pain and congestion.   Respiratory: Positive for cough.   Cardiovascular: Negative.   Gastrointestinal: Negative.   Endocrine: Negative.   Genitourinary: Negative.   Allergic/Immunologic: Negative.   Neurological: Negative.   Hematological: Negative.   Psychiatric/Behavioral:       Mild anxiety        Objective:   Physical Exam  Constitutional: She is oriented to person, place, and time. She appears well-developed and well-nourished. No distress.  HENT:  Head: Normocephalic and atraumatic.  Mouth/Throat: Oropharynx is clear and moist.  TMs are full and pink bilaterally  Eyes: Conjunctivae are normal. Pupils are equal, round, and reactive to light. Right eye exhibits no discharge. Left eye exhibits no discharge. No scleral icterus.  Neck: Neck supple. No JVD present. No thyromegaly present.  Cardiovascular: Normal rate, regular rhythm and intact distal pulses.   Murmur heard. II/VIsystolic ejection murmur  Pulmonary/Chest: Effort normal and breath sounds normal. No respiratory distress. She has no wheezes. She has no rales. She exhibits no tenderness.  Breasts normal female  Abdominal: Soft. Bowel sounds are normal. She exhibits no distension and no mass. There is no tenderness. There is no rebound and no guarding.  Genitourinary:  Pap taken. No masses appreciated on bimanual exam.  Musculoskeletal: Normal range of motion. She exhibits no edema.  Lymphadenopathy:    She has no cervical adenopathy.  Neurological: She is alert and oriented to person, place, and time. She has normal reflexes. She displays normal reflexes. No cranial nerve deficit. Coordination normal.  Skin: Skin is warm and dry. No rash noted. She is not diaphoretic. No erythema. No pallor.  Psychiatric: She has a normal mood and affect. Her behavior is normal. Judgment and thought content normal.          Assessment & Plan:  Bilateral otitis media  Bronchitis-treat otitis media and bronchitis with Levaquin  500 milligrams daily for 7 days.  Hypertension-continue losartan 100 mg daily and return in 3 weeks. May need to add diuretic.  History of hyperlipidemia-stable  History of anxiety-stable  Obesity-not exercising as much as she did previously. Needs to start exercise program once again  Mild elevation of  SGPT-has history of fatty liver and mild elevation of liver functions. Also consent alcohol on trip to [place]. This is a mild elevation and not really significant.  Possible diabetes mellitus-fasting glucose is 116. In hemoglobin A 1C  Plan: Levaquin 500 mg daily for 7 days. Continue losartan 100 mg daily and return in 3 weeks for reevaluation when she is feeling better.

## 2012-11-10 NOTE — Patient Instructions (Addendum)
Take antibiotics as directed. Return in 3 weeks for reevaluation. Continue losartan.

## 2012-11-30 ENCOUNTER — Other Ambulatory Visit: Payer: Self-pay

## 2012-11-30 DIAGNOSIS — Z1231 Encounter for screening mammogram for malignant neoplasm of breast: Secondary | ICD-10-CM

## 2012-12-20 ENCOUNTER — Ambulatory Visit
Admission: RE | Admit: 2012-12-20 | Discharge: 2012-12-20 | Disposition: A | Discharge status: Home / Self Care | Payer: BC Managed Care – PPO | Source: Ambulatory Visit

## 2012-12-20 DIAGNOSIS — Z1231 Encounter for screening mammogram for malignant neoplasm of breast: Secondary | ICD-10-CM

## 2012-12-21 ENCOUNTER — Ambulatory Visit (INDEPENDENT_AMBULATORY_CARE_PROVIDER_SITE_OTHER): Payer: BC Managed Care – PPO | Admitting: Internal Medicine

## 2012-12-21 ENCOUNTER — Encounter: Payer: Self-pay | Admitting: Internal Medicine

## 2012-12-21 VITALS — BP 128/94 | HR 84 | Wt 178.0 lb

## 2012-12-21 DIAGNOSIS — Z1211 Encounter for screening for malignant neoplasm of colon: Secondary | ICD-10-CM

## 2012-12-21 DIAGNOSIS — I10 Essential (primary) hypertension: Secondary | ICD-10-CM

## 2012-12-21 NOTE — Addendum Note (Signed)
Addended by: Judy Pimple on: 12/21/2012 04:30 PM   Modules accepted: Orders

## 2012-12-21 NOTE — Progress Notes (Signed)
  Subjective:    Patient ID: Gwendolyn Herman, female    DOB: [birth year], 62 y.o.   MRN: [id number]  HPI Here today to followup on hypertension. At last visit, HCTZ was added to losartan. Blood pressure has improved but could be a bit better. She recently had a mammogram which was normal. She agrees to do screening colonoscopy. Has never had colonoscopy. Appointment will be made for her. Declines influenza immunization. Order given for Zostavax vaccine.    Review of Systems     Objective:   Physical Exam Chest clear to auscultation. Cardiac exam regular rate and rhythm normal S1 and S2. Extremities without edema.        Assessment & Plan:  Hypertension  Plan: Add amlodipine 5 mg daily to losartan/HCTZ 100/25 generic. Return in 4 weeks.

## 2012-12-21 NOTE — Patient Instructions (Addendum)
And amlodipine 5 mg daily to losartan HCTZ 100/25. Return in 4 weeks

## 2013-01-05 ENCOUNTER — Encounter: Payer: Self-pay | Admitting: Internal Medicine

## 2013-01-27 ENCOUNTER — Ambulatory Visit (INDEPENDENT_AMBULATORY_CARE_PROVIDER_SITE_OTHER): Payer: BC Managed Care – PPO | Admitting: Internal Medicine

## 2013-01-27 ENCOUNTER — Encounter: Payer: Self-pay | Admitting: Internal Medicine

## 2013-01-27 VITALS — BP 118/78 | HR 60 | Temp 98.0°F | Ht 62.5 in | Wt 181.0 lb

## 2013-01-27 DIAGNOSIS — I10 Essential (primary) hypertension: Secondary | ICD-10-CM

## 2013-01-27 LAB — BASIC METABOLIC PANEL
Chloride: 103 mEq/L (ref 96–112)
Potassium: 4.1 mEq/L (ref 3.5–5.3)

## 2013-01-27 NOTE — Patient Instructions (Signed)
B-met drawn today. Continue same meds and return in 6 months

## 2013-01-27 NOTE — Progress Notes (Signed)
  Subjective:    Patient ID: Gwendolyn Herman, female    DOB: 1950-11-23, 62 y.o.   MRN: [id number]  HPI Patient now on Hyzaar and amlodipine.  Blood pressure is excellent. Tolerating medications well. Thought she might have some back pain initially but that may have just been overexertion. Has had no recent overseas travel. Feels well.    Review of Systems     Objective:   Physical Exam neck is supple without JVD thyromegaly or carotid bruits. Chest clear to auscultation. Cardiac exam regular rate and rhythm. Extremities without edema.        Assessment & Plan:  Hypertension- well controlled on Hyzaar and amlodipine  History of hyperlipidemia on statin therapy  Plan: Return March 2015 for six-month recheck. She will need fasting lipid panel liver functions and basic metabolic panel.

## 2013-02-28 ENCOUNTER — Encounter: Payer: Self-pay | Admitting: Internal Medicine

## 2013-02-28 ENCOUNTER — Ambulatory Visit (INDEPENDENT_AMBULATORY_CARE_PROVIDER_SITE_OTHER): Payer: BC Managed Care – PPO | Admitting: Internal Medicine

## 2013-02-28 ENCOUNTER — Ambulatory Visit
Admission: RE | Admit: 2013-02-28 | Discharge: 2013-02-28 | Disposition: A | Discharge status: Home / Self Care | Payer: BC Managed Care – PPO | Source: Ambulatory Visit | Attending: Internal Medicine | Admitting: Internal Medicine

## 2013-02-28 VITALS — BP 120/80 | HR 84 | Temp 98.3°F | Ht 62.5 in | Wt 181.0 lb

## 2013-02-28 DIAGNOSIS — H6693 Otitis media, unspecified, bilateral: Secondary | ICD-10-CM

## 2013-02-28 DIAGNOSIS — J9811 Atelectasis: Secondary | ICD-10-CM

## 2013-02-28 DIAGNOSIS — J9819 Other pulmonary collapse: Secondary | ICD-10-CM

## 2013-02-28 DIAGNOSIS — R05 Cough: Secondary | ICD-10-CM

## 2013-02-28 DIAGNOSIS — H6690 Otitis media, unspecified, unspecified ear: Secondary | ICD-10-CM

## 2013-02-28 DIAGNOSIS — R059 Cough, unspecified: Secondary | ICD-10-CM

## 2013-02-28 DIAGNOSIS — J029 Acute pharyngitis, unspecified: Secondary | ICD-10-CM

## 2013-02-28 DIAGNOSIS — J209 Acute bronchitis, unspecified: Secondary | ICD-10-CM

## 2013-02-28 MED ORDER — CEFTRIAXONE SODIUM 1 G IJ SOLR
1.0000 g | Freq: Once | INTRAMUSCULAR | Status: AC
  Administered 2013-02-28: 1 g via INTRAMUSCULAR

## 2013-02-28 MED ORDER — LEVOFLOXACIN 500 MG PO TABS: 500.0000 mg | ORAL_TABLET | Freq: Every day | ORAL | Status: DC

## 2013-03-01 ENCOUNTER — Encounter: Payer: Self-pay | Admitting: Internal Medicine

## 2013-03-01 ENCOUNTER — Ambulatory Visit (AMBULATORY_SURGERY_CENTER): Payer: Self-pay | Admitting: *Deleted

## 2013-03-01 VITALS — Ht 62.5 in | Wt 181.0 lb

## 2013-03-01 DIAGNOSIS — Z1211 Encounter for screening for malignant neoplasm of colon: Secondary | ICD-10-CM

## 2013-03-01 MED ORDER — MOVIPREP 100 G PO SOLR: 1.0000 | Freq: Once | ORAL | Status: DC

## 2013-03-01 NOTE — Progress Notes (Signed)
Denies allergies to eggs or soy products. Denies complications with sedation or anesthesia. 

## 2013-03-01 NOTE — Patient Instructions (Addendum)
Take Levaquin for 10 days. Take Hycodan at night for cough. Take Tessalon Perles during the day for cough. You have been given injection of Rocephin in the office today.

## 2013-03-01 NOTE — Progress Notes (Signed)
  Subjective:    Patient ID: Gwendolyn Herman, female    DOB: February 12, 1951, 62 y.o.   MRN: [id number]  HPI Patient has been coughing for 2-3 weeks. No fever or shaking chills. Has been around grandchildren who have been ill. She is leaving in a couple of days for [facility]. She walks 4 miles the other day and was a bit short of breath at the end of the walk. Slight sore throat.    Review of Systems     Objective:   Physical Exam left TM is full and dull. Right TM is full. Pharynx is red. Rapid strep screen negative. Neck is supple without significant adenopathy. Patient has inspiratory rales left lower lobe with slight wheezing in that area. Otherwise chest is clear. Chest x-ray shows atelectasis left lower lobe but no pneumonia        Assessment & Plan:  Bronchitis  Bilateral otitis media  Pharyngitis-non-strep  Plan: Rocephin 1 g IM. Levaquin 500 milligrams daily for 10 days. Hycodan 8 ounces 1 teaspoon by mouth every 6 hours when necessary cough but mainly take at night. Tessalon Perles 100 mg (#60) 2 by mouth 3 times a day when necessary for cough-take during the day

## 2013-03-03 ENCOUNTER — Encounter: Payer: Self-pay | Admitting: Internal Medicine

## 2013-03-09 ENCOUNTER — Other Ambulatory Visit: Payer: Self-pay | Admitting: Internal Medicine

## 2013-03-17 ENCOUNTER — Encounter: Payer: BC Managed Care – PPO | Admitting: Internal Medicine

## 2013-03-23 ENCOUNTER — Encounter: Payer: Self-pay | Admitting: Internal Medicine

## 2013-03-23 ENCOUNTER — Ambulatory Visit (AMBULATORY_SURGERY_CENTER): Payer: BC Managed Care – PPO | Admitting: Internal Medicine

## 2013-03-23 VITALS — BP 114/73 | HR 57 | Temp 96.7°F | Resp 17 | Ht 62.5 in | Wt 181.0 lb

## 2013-03-23 DIAGNOSIS — Z1211 Encounter for screening for malignant neoplasm of colon: Secondary | ICD-10-CM

## 2013-03-23 MED ORDER — SODIUM CHLORIDE 0.9 % IV SOLN: 500.0000 mL | INTRAVENOUS | Status: DC

## 2013-03-23 NOTE — Patient Instructions (Signed)

## 2013-03-23 NOTE — Progress Notes (Signed)
Patient did not experience any of the following events: a burn prior to discharge; a fall within the facility; wrong site/side/patient/procedure/implant event; or a hospital transfer or hospital admission upon discharge from the facility. (G8907) Patient did not have preoperative order for IV antibiotic SSI prophylaxis. (G8918)  

## 2013-03-23 NOTE — Progress Notes (Signed)
Pt stable to RR 

## 2013-03-23 NOTE — Op Note (Addendum)
[facility] [address] [place], [address]   COLONOSCOPY PROCEDURE REPORT  PATIENT: Gwendolyn Herman, Gwendolyn Herman  MR#: [id number] BIRTHDATE: May 19, 1950 , 62  yrs. old GENDER: Female ENDOSCOPIST: Hart Carwin, MD REFERRED XB:JYNW Waymond Cera, M.D. PROCEDURE DATE:  03/23/2013 PROCEDURE:   Colonoscopy, screening First Screening Colonoscopy - Avg.  risk and is 50 yrs.  old or older Yes.  Prior Negative Screening - Now for repeat screening. N/A  History of Adenoma - Now for follow-up colonoscopy & has been > or = to 3 yrs.  N/A  Polyps Removed Today? No.  Recommend repeat exam, <10 yrs? No. ASA CLASS:   Class I INDICATIONS:Average risk patient for colon cancer. MEDICATIONS: MAC sedation, administered by CRNA and propofol (Diprivan) 300mg  IV  DESCRIPTION OF PROCEDURE:   After the risks benefits and alternatives of the procedure were thoroughly explained, informed consent was obtained.  A digital rectal exam revealed no abnormalities of the rectum.   The LB [id number]-[id number]  endoscope was introduced through the anus and advanced to the cecum, which was identified by both the appendix and ileocecal valve. No adverse events experienced.   The quality of the prep was good, using MoviPrep  The instrument was then slowly withdrawn as the colon was fully examined.      COLON FINDINGS: A normal appearing cecum, ileocecal valve, and appendiceal orifice were identified.  The ascending, hepatic flexure, transverse, splenic flexure, descending, sigmoid colon and rectum appeared unremarkable.  No polyps or cancers were seen. Retroflexed views revealed no abnormalities. The time to cecum=11 minutes 45 seconds.  Withdrawal time=4 minutes 53 seconds.  The scope was withdrawn and the procedure completed. COMPLICATIONS: There were no complications.  ENDOSCOPIC IMPRESSION: Normal colon  RECOMMENDATIONS: 1.  High fiber diet 2.   recall colonoscopy in 10 years   eSigned:  Hart Carwin, MD 03/23/2013 8:40 AM Revised: 03/23/2013 8:40 AM  cc:

## 2013-03-24 ENCOUNTER — Telehealth: Payer: Self-pay

## 2013-03-24 NOTE — Telephone Encounter (Signed)
Left message on answering machine. 

## 2013-03-25 ENCOUNTER — Other Ambulatory Visit: Payer: Self-pay | Admitting: Internal Medicine

## 2013-06-28 ENCOUNTER — Other Ambulatory Visit: Payer: BC Managed Care – PPO | Admitting: Internal Medicine

## 2013-06-28 ENCOUNTER — Other Ambulatory Visit: Payer: Self-pay | Admitting: Internal Medicine

## 2013-06-28 DIAGNOSIS — Z79899 Other long term (current) drug therapy: Secondary | ICD-10-CM

## 2013-06-28 DIAGNOSIS — E785 Hyperlipidemia, unspecified: Secondary | ICD-10-CM

## 2013-06-28 DIAGNOSIS — I10 Essential (primary) hypertension: Secondary | ICD-10-CM

## 2013-06-28 LAB — HEPATIC FUNCTION PANEL
ALT: 24 U/L (ref 0–35)
AST: 22 U/L (ref 0–37)
Albumin: 4.2 g/dL (ref 3.5–5.2)
Alkaline Phosphatase: 61 U/L (ref 39–117)
BILIRUBIN INDIRECT: 0.4 mg/dL (ref 0.2–1.2)
Bilirubin, Direct: 0.1 mg/dL (ref 0.0–0.3)
TOTAL PROTEIN: 6.8 g/dL (ref 6.0–8.3)
Total Bilirubin: 0.5 mg/dL (ref 0.2–1.2)

## 2013-06-28 LAB — BASIC METABOLIC PANEL
BUN: 20 mg/dL (ref 6–23)
CO2: 28 mEq/L (ref 19–32)
CREATININE: 0.69 mg/dL (ref 0.50–1.10)
Calcium: 9.3 mg/dL (ref 8.4–10.5)
Chloride: 105 mEq/L (ref 96–112)
GLUCOSE: 109 mg/dL — AB (ref 70–99)
Potassium: 4.5 mEq/L (ref 3.5–5.3)
Sodium: 140 mEq/L (ref 135–145)

## 2013-06-28 LAB — LIPID PANEL
CHOLESTEROL: 187 mg/dL (ref 0–200)
HDL: 54 mg/dL (ref >39)
LDL Cholesterol: 107 mg/dL — ABNORMAL HIGH (ref 0–99)
TRIGLYCERIDES: 129 mg/dL (ref <150)
Total CHOL/HDL Ratio: 3.5 Ratio
VLDL: 26 mg/dL (ref 0–40)

## 2013-06-30 ENCOUNTER — Encounter: Payer: Self-pay | Admitting: Internal Medicine

## 2013-06-30 ENCOUNTER — Ambulatory Visit (INDEPENDENT_AMBULATORY_CARE_PROVIDER_SITE_OTHER): Payer: BC Managed Care – PPO | Admitting: Internal Medicine

## 2013-06-30 VITALS — BP 130/82 | HR 72 | Temp 98.7°F | Wt 181.0 lb

## 2013-06-30 DIAGNOSIS — I10 Essential (primary) hypertension: Secondary | ICD-10-CM

## 2013-06-30 DIAGNOSIS — E785 Hyperlipidemia, unspecified: Secondary | ICD-10-CM

## 2013-06-30 DIAGNOSIS — G4700 Insomnia, unspecified: Secondary | ICD-10-CM

## 2013-06-30 DIAGNOSIS — E669 Obesity, unspecified: Secondary | ICD-10-CM

## 2013-06-30 LAB — HEMOGLOBIN A1C
Hgb A1c MFr Bld: 6.3 % — ABNORMAL HIGH (ref <5.7)
MEAN PLASMA GLUCOSE: 134 mg/dL — AB (ref <117)

## 2013-06-30 MED ORDER — ALPRAZOLAM 0.25 MG PO TABS: 0.2500 mg | ORAL_TABLET | Freq: Two times a day (BID) | ORAL | Status: DC | PRN

## 2013-06-30 NOTE — Progress Notes (Signed)
   Subjective:    Patient ID: Gwendolyn Herman, female    DOB: 09-14-1950, 63 y.o.   MRN: [id number]  HPI In today for followup on hyperlipidemia  and hypertension. Walking 4 miles a day. Has stopped drinking red wine and has been drinking beer recently. However, liver functions have improved. Weight remains at 181 pounds. She is frustrated she can't lose weight. I think beer drinking has contributed to her weight issues. Had colonoscopy with Dr. Juanda ChanceBrodie December 2014.    Review of Systems     Objective:   Physical Exam Neck: supple without JVD thyromegaly or carotid bruits. Chest: clear to auscultation. Cardiac exam: regular rate and rhythm normal S1 and S2. Extremities without edema.       Assessment & Plan:  Hyperlipidemia  Hypertension  Obesity  Insomnia-take Xanax sparingly for sleep  Plan: Encouraged diet and exercise and return for physical examination in 4 months  25 minutes spent with patient going over these issues

## 2013-06-30 NOTE — Patient Instructions (Addendum)
Take Xanax  As needed for insomnia. Stay on same meds and RTC in 4-6 months.

## 2013-07-01 ENCOUNTER — Other Ambulatory Visit: Payer: Self-pay

## 2013-07-01 ENCOUNTER — Telehealth: Payer: Self-pay | Admitting: Internal Medicine

## 2013-07-01 DIAGNOSIS — R7303 Prediabetes: Secondary | ICD-10-CM

## 2013-07-01 MED ORDER — METFORMIN HCL 500 MG PO TABS: 500.0000 mg | ORAL_TABLET | Freq: Two times a day (BID) | ORAL | Status: DC

## 2013-07-01 NOTE — Progress Notes (Signed)
Patient informed. 

## 2013-07-01 NOTE — Telephone Encounter (Signed)
Patient alarmed at elevated hemoglobin A1c. She does want to go to dietitian. Has been drinking a fair amount of beer lately. Stopped  drinking red wine. Reluctant to go metformin at this time. She agrees to see dietitian and have repeat hemoglobin A1c without office visit in 3 months. We will hold off on metformin.

## 2013-08-02 ENCOUNTER — Ambulatory Visit: Payer: BC Managed Care – PPO | Admitting: Internal Medicine

## 2013-08-05 ENCOUNTER — Encounter: Payer: Self-pay | Admitting: *Deleted

## 2013-08-05 ENCOUNTER — Encounter: Payer: BC Managed Care – PPO | Attending: Internal Medicine | Admitting: *Deleted

## 2013-08-05 VITALS — Ht 62.0 in | Wt 177.4 lb

## 2013-08-05 DIAGNOSIS — R7309 Other abnormal glucose: Secondary | ICD-10-CM | POA: Insufficient documentation

## 2013-08-05 DIAGNOSIS — R7303 Prediabetes: Secondary | ICD-10-CM

## 2013-08-05 DIAGNOSIS — Z713 Dietary counseling and surveillance: Secondary | ICD-10-CM | POA: Insufficient documentation

## 2013-08-05 NOTE — Progress Notes (Signed)
Medical Nutrition Therapy:  Appt start time: 1000 end time:  1215.  Assessment:  Patient here today with pre-diabetes. She also desires weight loss. HgbA1c in March was 6.3. Patient is concerned about this, and wants to make changes to her diet and exercise to prevent development into diabetes. She tries to eat healthy, and has made an effort to reduce intake over the last month. She has lost about 4 pounds over this time. She does admit to drinking beer (IPAs) on a regular basis (~12-18 ounces daily). She also walks regularly, about 4 miles 4-5 days weekly in addition to walking her dogs for 15-20 minutes most days. She has questions regarding adding strength training, yoga, or other activities to her workout routine. She has a [employer] membership. Weight loss is her primary goal with a short term goal of around 10 pounds.   MEDICATIONS: See list   DIETARY INTAKE:   Usual eating pattern includes 3 meals and 1-2 snacks per day.  24-hr recall:  B ( AM): Breakfast shakes  Snk ( AM): None  L ( PM): Sandwich, fruit OR leftovers Snk ( PM): Sometimes, string cheese, yogurt, fruit D ( PM): Baked chicken/meat, vegetables (salads), some starch Snk ( PM): Sometimes, see above Beverages: water, juice, beer  Usual physical activity: 4 miles walking   Estimated energy needs: 1400 calories 175 g carbohydrates 88 g protein 39 g fat  Progress Towards Goal(s):  In progress.   Nutritional Diagnosis:  [place]-2.2 Altered nutrition-related laboratory As related to impaired glucose tolerance.  As evidenced by HgbA1c 6.3.    Intervention:  Nutrition counseling. We discussed strategies for weight loss, including balancing nutrients (carbs, protein, fat), portion control, healthy snacks, and exercise. We also discussed basic carb counting to monitor intake.   Goals:  1. 1 pound weight loss per week. Short term weight goal: 165 pounds in 3 months 2. Balance macronutrients at meals. Follow meal plan outline provided  most days of the week.  3. Monitor portion size of starches, proteins, and fats.  4. Reduce intake of beer.  5. Continue exercising as currently. Consider going to the gym 2 days weekly and trying different activities (weights, yoga, spin, etc.)  Handouts given during visit include:  Weight loss tips  Meal plan card  Monitoring/Evaluation:  Dietary intake, exercise, BG, and body weight in 6 week(s).

## 2013-09-19 ENCOUNTER — Encounter: Payer: BC Managed Care – PPO | Attending: Internal Medicine | Admitting: *Deleted

## 2013-09-19 ENCOUNTER — Encounter: Payer: Self-pay | Admitting: *Deleted

## 2013-09-19 VITALS — Ht 62.0 in | Wt 174.5 lb

## 2013-09-19 DIAGNOSIS — Z713 Dietary counseling and surveillance: Secondary | ICD-10-CM | POA: Insufficient documentation

## 2013-09-19 DIAGNOSIS — E669 Obesity, unspecified: Secondary | ICD-10-CM

## 2013-09-19 DIAGNOSIS — R7309 Other abnormal glucose: Secondary | ICD-10-CM | POA: Insufficient documentation

## 2013-09-19 NOTE — Progress Notes (Signed)
Medical Nutrition Therapy:  Appt start time: 1130 end time:  1200.  Assessment:  Patient here for a weight management follow up. Her weight is down 3 pounds from visit 6 weeks ago (0.5 pounds per week). She reports that she continues to exercise regularly, averaging at least 12,000 steps daily on her [employer] fitness tracker. She continues to eat healthy. However, she struggles with portion control of certain foods. She also ate more than usual this weekend. She does admit that drinking beer socially is an issue in regard to calorie intake. We discussed realistic weight loss goals, and the importance of reconciling eating desires with weight loss goals.   MEDICATIONS: See list   DIETARY INTAKE:   Usual eating pattern includes 3 meals and 2-3 snacks per day.  24-hr recall:  B ( AM): Breakfast shakes  Snk ( AM): None  L ( PM): Sandwich, fruit OR leftovers  Snk ( PM): Sometimes, string cheese, yogurt, fruit  D ( PM): Baked chicken/meat, vegetables (salads), some starch  Snk ( PM): Sometimes, see above  Beverages: water, juice, beer   Usual physical activity: 4 miles walking most days, cycling 10-15 miles 1 day weekly  Estimated energy needs:  1400 calories  175 g carbohydrates  88 g protein  39 g fat  Progress Towards Goal(s):  Some progress.   Nutritional Diagnosis:  -2.2 Altered nutrition-related laboratory As related to impaired glucose tolerance. As evidenced by HgbA1c 6.3.     Intervention:  Nutrition counseling. Reinforced strategies for weight loss. Patient praised on her efforts and encouraged to continue exercising as currently  Goals: 1. 1 pound weight loss per week. Short term weight goal: 170 pounds at next visit 2. Continue to balance macronutrients at meals. Follow meal plan outline provided most days of the week.  3. Monitor portion size of starches, proteins, and fats.  4. Reduce intake of beer. Limit intake during the week to reduce calories 5. Continue exercising  as currently. Consider going to the gym 2 days weekly and trying different activities (weights, yoga, spin, etc.)   Monitoring/Evaluation:  Dietary intake, exercise, blood glucose, and body weight in 6 week(s).

## 2013-10-08 ENCOUNTER — Other Ambulatory Visit: Payer: Self-pay | Admitting: Internal Medicine

## 2013-10-28 ENCOUNTER — Ambulatory Visit: Payer: BC Managed Care – PPO | Admitting: *Deleted

## 2013-10-31 ENCOUNTER — Other Ambulatory Visit: Payer: Self-pay | Admitting: Internal Medicine

## 2013-10-31 ENCOUNTER — Other Ambulatory Visit: Payer: BC Managed Care – PPO | Admitting: Internal Medicine

## 2013-10-31 DIAGNOSIS — Z1329 Encounter for screening for other suspected endocrine disorder: Secondary | ICD-10-CM

## 2013-10-31 DIAGNOSIS — I10 Essential (primary) hypertension: Secondary | ICD-10-CM

## 2013-10-31 DIAGNOSIS — Z79899 Other long term (current) drug therapy: Secondary | ICD-10-CM

## 2013-10-31 DIAGNOSIS — Z13228 Encounter for screening for other metabolic disorders: Secondary | ICD-10-CM

## 2013-10-31 DIAGNOSIS — Z130 Encounter for screening for diseases of the blood and blood-forming organs and certain disorders involving the immune mechanism: Secondary | ICD-10-CM

## 2013-10-31 DIAGNOSIS — E785 Hyperlipidemia, unspecified: Secondary | ICD-10-CM

## 2013-10-31 LAB — CBC WITH DIFFERENTIAL/PLATELET
BASOS ABS: 0.1 10*3/uL (ref 0.0–0.1)
Basophils Relative: 1 % (ref 0–1)
EOS PCT: 3 % (ref 0–5)
Eosinophils Absolute: 0.2 10*3/uL (ref 0.0–0.7)
HCT: 39.2 % (ref 36.0–46.0)
Hemoglobin: 13.4 g/dL (ref 12.0–15.0)
Lymphocytes Relative: 33 % (ref 12–46)
Lymphs Abs: 1.7 10*3/uL (ref 0.7–4.0)
MCH: 31.5 pg (ref 26.0–34.0)
MCHC: 34.2 g/dL (ref 30.0–36.0)
MCV: 92 fL (ref 78.0–100.0)
Monocytes Absolute: 0.4 10*3/uL (ref 0.1–1.0)
Monocytes Relative: 7 % (ref 3–12)
NEUTROS ABS: 2.9 10*3/uL (ref 1.7–7.7)
Neutrophils Relative %: 56 % (ref 43–77)
Platelets: 294 10*3/uL (ref 150–400)
RBC: 4.26 MIL/uL (ref 3.87–5.11)
RDW: 13.4 % (ref 11.5–15.5)
WBC: 5.2 10*3/uL (ref 4.0–10.5)

## 2013-10-31 LAB — TSH: TSH: 1.235 u[IU]/mL (ref 0.350–4.500)

## 2013-11-01 ENCOUNTER — Encounter: Payer: Self-pay | Admitting: Internal Medicine

## 2013-11-01 ENCOUNTER — Ambulatory Visit (INDEPENDENT_AMBULATORY_CARE_PROVIDER_SITE_OTHER): Payer: BC Managed Care – PPO | Admitting: Internal Medicine

## 2013-11-01 VITALS — BP 120/74 | HR 80 | Temp 98.8°F | Ht 62.0 in | Wt 165.0 lb

## 2013-11-01 DIAGNOSIS — I10 Essential (primary) hypertension: Secondary | ICD-10-CM

## 2013-11-01 DIAGNOSIS — Z23 Encounter for immunization: Secondary | ICD-10-CM

## 2013-11-01 DIAGNOSIS — E785 Hyperlipidemia, unspecified: Secondary | ICD-10-CM

## 2013-11-01 DIAGNOSIS — E119 Type 2 diabetes mellitus without complications: Secondary | ICD-10-CM

## 2013-11-01 DIAGNOSIS — Z0000 Encounter for general adult medical examination without abnormal findings: Secondary | ICD-10-CM

## 2013-11-01 LAB — LIPID PANEL
CHOL/HDL RATIO: 2.9 ratio
Cholesterol: 164 mg/dL (ref 0–200)
HDL: 56 mg/dL (ref >39)
LDL CALC: 84 mg/dL (ref 0–99)
Triglycerides: 118 mg/dL (ref <150)
VLDL: 24 mg/dL (ref 0–40)

## 2013-11-01 LAB — COMPREHENSIVE METABOLIC PANEL
ALK PHOS: 55 U/L (ref 39–117)
ALT: 25 U/L (ref 0–35)
AST: 23 U/L (ref 0–37)
Albumin: 4 g/dL (ref 3.5–5.2)
BUN: 27 mg/dL — AB (ref 6–23)
CO2: 26 mEq/L (ref 19–32)
CREATININE: 0.83 mg/dL (ref 0.50–1.10)
Calcium: 9.8 mg/dL (ref 8.4–10.5)
Chloride: 103 mEq/L (ref 96–112)
Glucose, Bld: 119 mg/dL — ABNORMAL HIGH (ref 70–99)
Potassium: 4.4 mEq/L (ref 3.5–5.3)
Sodium: 139 mEq/L (ref 135–145)
Total Bilirubin: 0.7 mg/dL (ref 0.2–1.2)
Total Protein: 6.8 g/dL (ref 6.0–8.3)

## 2013-11-01 LAB — POCT URINALYSIS DIPSTICK
Bilirubin, UA: NEGATIVE
Glucose, UA: NEGATIVE
Ketones, UA: NEGATIVE
Leukocytes, UA: NEGATIVE
NITRITE UA: NEGATIVE
PH UA: 6.5
PROTEIN UA: NEGATIVE
SPEC GRAV UA: 1.015
Urobilinogen, UA: NEGATIVE

## 2013-11-01 LAB — VITAMIN D 25 HYDROXY (VIT D DEFICIENCY, FRACTURES): Vit D, 25-Hydroxy: 53 ng/mL (ref 30–89)

## 2013-11-01 LAB — HEMOGLOBIN A1C
Hgb A1c MFr Bld: 6.3 % — ABNORMAL HIGH (ref <5.7)
MEAN PLASMA GLUCOSE: 134 mg/dL — AB (ref <117)

## 2013-11-01 MED ORDER — TETANUS-DIPHTH-ACELL PERTUSSIS 5-2.5-18.5 LF-MCG/0.5 IM SUSP: 0.5000 mL | Freq: Once | INTRAMUSCULAR | Status: DC

## 2013-11-02 ENCOUNTER — Telehealth: Payer: Self-pay

## 2013-11-02 NOTE — Telephone Encounter (Signed)
Patient informed of A1c. Same as 4 months ago. Also, scheduled for an appointment with Dr. Jorja Loaafeen on 11/22/2013 at 11:30 am. [facility] [place] [facility].

## 2013-11-03 ENCOUNTER — Other Ambulatory Visit: Payer: BC Managed Care – PPO | Admitting: Internal Medicine

## 2013-11-03 NOTE — Progress Notes (Signed)
Patient informed. 

## 2013-11-04 ENCOUNTER — Encounter: Payer: BC Managed Care – PPO | Admitting: Internal Medicine

## 2013-11-11 ENCOUNTER — Other Ambulatory Visit: Payer: Self-pay | Admitting: Internal Medicine

## 2013-11-14 ENCOUNTER — Other Ambulatory Visit: Payer: Self-pay

## 2013-11-22 ENCOUNTER — Other Ambulatory Visit: Payer: Self-pay | Admitting: Dermatology

## 2013-12-11 DIAGNOSIS — G4700 Insomnia, unspecified: Secondary | ICD-10-CM | POA: Insufficient documentation

## 2013-12-13 ENCOUNTER — Other Ambulatory Visit: Payer: Self-pay

## 2013-12-13 DIAGNOSIS — Z1231 Encounter for screening mammogram for malignant neoplasm of breast: Secondary | ICD-10-CM

## 2014-01-03 ENCOUNTER — Ambulatory Visit
Admission: RE | Admit: 2014-01-03 | Discharge: 2014-01-03 | Disposition: A | Discharge status: Home / Self Care | Payer: BC Managed Care – PPO | Source: Ambulatory Visit

## 2014-01-03 DIAGNOSIS — Z1231 Encounter for screening mammogram for malignant neoplasm of breast: Secondary | ICD-10-CM

## 2014-01-04 ENCOUNTER — Encounter: Payer: Self-pay | Admitting: Internal Medicine

## 2014-01-04 NOTE — Patient Instructions (Signed)
Continue same medications for hypertension and hyperlipidemia. She has developed diabetes mellitus. Will need to watch diet and continue exercising. Needs to check Accu-Cheks a.c. several times weekly. Followup in 3 months.

## 2014-01-04 NOTE — Progress Notes (Signed)
   Subjective:    Patient ID: Gwendolyn Herman, female    DOB: [birth year], 63 y.o.   MRN: [id number]  HPI  63 year old White Female in today for health maintenance exam and evaluation of medical issues. History of hypertension and hyperlipidemia.  Past medical history: Fractured right foot 1972, history of bilateral carpal tunnel syndrome, history of occasional anxiety. She has a history of systolic murmur. Had 2-D echocardiogram in Aug 22, 2003 which was normal. Was started on losartan in May 2011. She was on Zocor for while and then was switched to Lipitor in August 2011.  Family history: Father died in 2001/08/21 in his sleep at age 68. Mother died in 69 with history of congestive heart failure. One sister with history of hypertension and hyperlipidemia.  Social history: She is married to a Warden/ranger. Husband operates a [employer] They travel quite a bit. She does not smoke. Social alcohol consumption. She exercises regularly. Has adult children.    Review of Systems  Constitutional: Negative for fever, appetite change and fatigue.  HENT: Negative.   Cardiovascular: Negative.   Gastrointestinal: Negative.   Genitourinary: Negative.   Neurological: Negative.   Psychiatric/Behavioral: Negative.   All other systems reviewed and are negative.      Objective:   Physical Exam  Constitutional: She is oriented to person, place, and time. She appears well-developed and well-nourished. No distress.  HENT:  Head: Normocephalic and atraumatic.  Right Ear: External ear normal.  Left Ear: External ear normal.  Mouth/Throat: No oropharyngeal exudate.  Eyes: Conjunctivae are normal. Right eye exhibits no discharge. Left eye exhibits no discharge. No scleral icterus.  Neck: No JVD present. No thyromegaly present.  Cardiovascular: Normal rate, regular rhythm and intact distal pulses.   Murmur heard. 2/6 systolic ejection murmur  Pulmonary/Chest: Effort normal and breath sounds normal. No  respiratory distress. She has no rales. She exhibits no tenderness.  Abdominal: Soft. Bowel sounds are normal. She exhibits no distension and no mass. There is no rebound and no guarding.  Genitourinary:  Pap done 2012/08/21. Bimanual normal.  Musculoskeletal: Normal range of motion. She exhibits no edema.  Neurological: She is alert and oriented to person, place, and time. She has normal reflexes. She displays normal reflexes. No cranial nerve deficit.  Skin: Skin is dry. No rash noted. She is not diaphoretic.  Psychiatric: She has a normal mood and affect. Her behavior is normal. Thought content normal.          Assessment & Plan:  Hypertension  Hyperlipidemia  Plan

## 2014-04-10 ENCOUNTER — Other Ambulatory Visit: Payer: Self-pay | Admitting: Internal Medicine

## 2014-05-11 ENCOUNTER — Other Ambulatory Visit: Payer: BLUE CROSS/BLUE SHIELD | Admitting: Internal Medicine

## 2014-05-11 DIAGNOSIS — Z79899 Other long term (current) drug therapy: Secondary | ICD-10-CM

## 2014-05-11 DIAGNOSIS — E119 Type 2 diabetes mellitus without complications: Secondary | ICD-10-CM

## 2014-05-11 DIAGNOSIS — E785 Hyperlipidemia, unspecified: Secondary | ICD-10-CM

## 2014-05-11 LAB — HEPATIC FUNCTION PANEL
ALK PHOS: 59 U/L (ref 39–117)
ALT: 29 U/L (ref 0–35)
AST: 24 U/L (ref 0–37)
Albumin: 4.3 g/dL (ref 3.5–5.2)
BILIRUBIN DIRECT: 0.1 mg/dL (ref 0.0–0.3)
BILIRUBIN INDIRECT: 0.5 mg/dL (ref 0.2–1.2)
Total Bilirubin: 0.6 mg/dL (ref 0.2–1.2)
Total Protein: 6.6 g/dL (ref 6.0–8.3)

## 2014-05-11 LAB — LIPID PANEL
CHOL/HDL RATIO: 3.1 ratio
Cholesterol: 180 mg/dL (ref 0–200)
HDL: 58 mg/dL (ref >39)
LDL Cholesterol: 104 mg/dL — ABNORMAL HIGH (ref 0–99)
TRIGLYCERIDES: 90 mg/dL (ref <150)
VLDL: 18 mg/dL (ref 0–40)

## 2014-05-12 ENCOUNTER — Ambulatory Visit (INDEPENDENT_AMBULATORY_CARE_PROVIDER_SITE_OTHER): Payer: BLUE CROSS/BLUE SHIELD | Admitting: Internal Medicine

## 2014-05-12 ENCOUNTER — Encounter: Payer: Self-pay | Admitting: Internal Medicine

## 2014-05-12 VITALS — BP 132/78 | HR 68 | Temp 97.6°F | Wt 171.0 lb

## 2014-05-12 DIAGNOSIS — E785 Hyperlipidemia, unspecified: Secondary | ICD-10-CM

## 2014-05-12 DIAGNOSIS — E669 Obesity, unspecified: Secondary | ICD-10-CM

## 2014-05-12 DIAGNOSIS — E119 Type 2 diabetes mellitus without complications: Secondary | ICD-10-CM

## 2014-05-12 DIAGNOSIS — E8881 Metabolic syndrome: Secondary | ICD-10-CM

## 2014-05-12 DIAGNOSIS — I10 Essential (primary) hypertension: Secondary | ICD-10-CM

## 2014-05-12 LAB — HEMOGLOBIN A1C
HEMOGLOBIN A1C: 6.1 % — AB (ref <5.7)
Mean Plasma Glucose: 128 mg/dL — ABNORMAL HIGH (ref <117)

## 2014-05-14 ENCOUNTER — Encounter: Payer: Self-pay | Admitting: Internal Medicine

## 2014-05-14 NOTE — Progress Notes (Signed)
   Subjective:    Patient ID: Duncan DullSandra L Deltoro, female    DOB: Feb 19, 1951, 64 y.o.   MRN: [id number]  HPI In today to follow-up on hypertension, hyperlipidemia, obesity, metabolic syndrome, controlled type 2 diabetes. She just got back from a trip to [place]. Admits she's not been following a strict diet. Planning to go to [place] in the Spring. Will also be going to [place] in May. She feels well. She enjoyed her trip very much.  No new complaints          Review of Systems     Objective:   Physical Exam  Skin warm and dry. Nodes none. Neck is supple without JVD thyromegaly or carotid bruits. Chest clear to auscultation. Cardiac exam regular rate and rhythm. Extremities without edema      Assessment & Plan:  Hypertension-stable on current regimen  Hyperlipidemia-LDL cholesterol has increased from 84-104  Controlled type 2 diabetes-hemoglobin A1c 6.1% and previously was 6.3%  Obesity  Metabolic syndrome  Plan: Patient will continue same medications and return in May for four-month follow-up with lab studies.  25 minutes spent with patient reviewing medical issues, reviewing lab results, counseling and coordination of care.

## 2014-05-14 NOTE — Patient Instructions (Signed)
Continue same medications. Reminded about diet and exercise. Return in May for follow-up

## 2014-05-16 ENCOUNTER — Other Ambulatory Visit: Payer: Self-pay | Admitting: *Deleted

## 2014-05-16 ENCOUNTER — Telehealth: Payer: Self-pay | Admitting: Internal Medicine

## 2014-05-16 MED ORDER — METFORMIN HCL 500 MG PO TABS: 500.0000 mg | ORAL_TABLET | Freq: Two times a day (BID) | ORAL | Status: DC

## 2014-05-16 MED ORDER — METFORMIN HCL 500 MG PO TABS: 500.0000 mg | ORAL_TABLET | Freq: Every day | ORAL | Status: DC

## 2014-05-16 NOTE — Telephone Encounter (Signed)
Patient states she only wants to try taking it once daily to start OK per Dr Lenord FellersBaxley . New script sent to patient pharmacy

## 2014-05-16 NOTE — Telephone Encounter (Signed)
Patient called to inquire about a medication.  States she believe she and Dr. Lenord FellersBaxley discussed starting a medication but wasn't sure.  Nothing was called in to her drug store.  Spoke with Dr. Lenord FellersBaxley.  Patient actually has Metformin on her medication list from March, 2015 but never started taking it.  Per her discussion with Dr. Lenord FellersBaxley at her visit 05/12/14, she would give the medication a try, however, she was concerned with the side effect of diarrhea.  Therefore, she was only going to try it 1 time a day.    Patient called today stating it wasn't at her pharmacy.  Gwendolyn ChessmanSuzanne will e-scribe correct dosage of Metformin 500mg  1 daily to patient pharmacy.  Spoke with patient and advised patient to take with a meal once daily.    She inquired if an avocado was enough.  Advised no.  She needs to have a substantial meal on her gut to take the pill.  She is to take the pill either with lunch or dinner.  Patient verbalized understanding of these instructions.

## 2014-05-16 NOTE — Telephone Encounter (Signed)
Metformin refills called into pharmacy

## 2014-06-06 ENCOUNTER — Telehealth: Payer: Self-pay | Admitting: Internal Medicine

## 2014-06-06 NOTE — Telephone Encounter (Signed)
Office visit to discuss Diamox

## 2014-06-06 NOTE — Telephone Encounter (Signed)
Patient is traveling to [place] in April.  She has been told that she should consider that there will be HIGH altitudes there.  She may want to take some medications to prevent altitude sickness.  She has been told that chlorophyll tablets taken before she leaves may assist with this.  She wants to know if you can attest to this or if you know of anything that she should take or do to prepare for this trip and the high altitudes?  Please advise.

## 2014-06-09 ENCOUNTER — Other Ambulatory Visit: Payer: Self-pay | Admitting: Internal Medicine

## 2014-06-09 NOTE — Telephone Encounter (Signed)
Refill once 

## 2014-06-09 NOTE — Telephone Encounter (Signed)
Tramadol refilled x1

## 2014-06-13 ENCOUNTER — Encounter: Payer: Self-pay | Admitting: Internal Medicine

## 2014-06-13 ENCOUNTER — Ambulatory Visit (INDEPENDENT_AMBULATORY_CARE_PROVIDER_SITE_OTHER): Payer: BLUE CROSS/BLUE SHIELD | Admitting: Internal Medicine

## 2014-06-13 VITALS — BP 122/76 | HR 85 | Temp 97.6°F | Wt 173.0 lb

## 2014-06-13 DIAGNOSIS — Z418 Encounter for other procedures for purposes other than remedying health state: Secondary | ICD-10-CM | POA: Diagnosis not present

## 2014-06-13 DIAGNOSIS — Z298 Encounter for other specified prophylactic measures: Secondary | ICD-10-CM

## 2014-06-13 NOTE — Patient Instructions (Signed)
Take Diamox 125 mg every 12 hours. Start medication one day before trip and continue one day after leaving high-altitude location

## 2014-06-13 NOTE — Progress Notes (Signed)
   Subjective:    Patient ID: Gwendolyn Herman, female    DOB: 11-13-1950, 64 y.o.   MRN: [id number]  HPI 64 year old Female going to [place] in April had a mountain location with elevation of around 11,000 feet. Wants to talk about medication for altitude sickness prevention. She has a history of hypertension and diabetes. She has hyperlipidemia. She is overweight.    Review of Systems     Objective:   Physical Exam  Spent 15 minutes speaking with her about altitude sickness prevention. She is asking whether she should take cocoa extract or leaves and I do not think so based on the information provided by [employer]. She will be gone around 8 days but will have a day or 2 coming and going on either end of the trip      Assessment & Plan:  Altitude sickness prevention  Plan: Diamox 125 mg every 12 hours to start one day before trip and continue one day post trip #20.

## 2014-07-21 ENCOUNTER — Other Ambulatory Visit: Payer: Self-pay | Admitting: Internal Medicine

## 2014-08-29 ENCOUNTER — Other Ambulatory Visit: Payer: BLUE CROSS/BLUE SHIELD | Admitting: Internal Medicine

## 2014-08-29 DIAGNOSIS — E119 Type 2 diabetes mellitus without complications: Secondary | ICD-10-CM

## 2014-08-29 DIAGNOSIS — E785 Hyperlipidemia, unspecified: Secondary | ICD-10-CM

## 2014-08-29 DIAGNOSIS — Z79899 Other long term (current) drug therapy: Secondary | ICD-10-CM

## 2014-08-29 LAB — HEPATIC FUNCTION PANEL
ALT: 35 U/L (ref 0–35)
AST: 24 U/L (ref 0–37)
Albumin: 4 g/dL (ref 3.5–5.2)
Alkaline Phosphatase: 54 U/L (ref 39–117)
BILIRUBIN DIRECT: 0.1 mg/dL (ref 0.0–0.3)
BILIRUBIN INDIRECT: 0.5 mg/dL (ref 0.2–1.2)
Total Bilirubin: 0.6 mg/dL (ref 0.2–1.2)
Total Protein: 6.7 g/dL (ref 6.0–8.3)

## 2014-08-29 LAB — LIPID PANEL
Cholesterol: 196 mg/dL (ref 0–200)
HDL: 54 mg/dL (ref >=46)
LDL CALC: 107 mg/dL — AB (ref 0–99)
Total CHOL/HDL Ratio: 3.6 Ratio
Triglycerides: 175 mg/dL — ABNORMAL HIGH (ref <150)
VLDL: 35 mg/dL (ref 0–40)

## 2014-08-29 LAB — HEMOGLOBIN A1C
Hgb A1c MFr Bld: 6.3 % — ABNORMAL HIGH (ref <5.7)
MEAN PLASMA GLUCOSE: 134 mg/dL — AB (ref <117)

## 2014-08-31 ENCOUNTER — Encounter: Payer: Self-pay | Admitting: Internal Medicine

## 2014-08-31 ENCOUNTER — Ambulatory Visit (INDEPENDENT_AMBULATORY_CARE_PROVIDER_SITE_OTHER): Payer: BLUE CROSS/BLUE SHIELD | Admitting: Internal Medicine

## 2014-08-31 VITALS — BP 126/76 | HR 74 | Temp 98.0°F | Ht 62.0 in | Wt 172.5 lb

## 2014-08-31 DIAGNOSIS — E119 Type 2 diabetes mellitus without complications: Secondary | ICD-10-CM | POA: Diagnosis not present

## 2014-08-31 DIAGNOSIS — E785 Hyperlipidemia, unspecified: Secondary | ICD-10-CM | POA: Diagnosis not present

## 2014-08-31 DIAGNOSIS — E8881 Metabolic syndrome: Secondary | ICD-10-CM | POA: Diagnosis not present

## 2014-08-31 DIAGNOSIS — I10 Essential (primary) hypertension: Secondary | ICD-10-CM

## 2014-08-31 DIAGNOSIS — E669 Obesity, unspecified: Secondary | ICD-10-CM

## 2014-08-31 MED ORDER — METFORMIN HCL 500 MG PO TABS: 500.0000 mg | ORAL_TABLET | Freq: Two times a day (BID) | ORAL | Status: DC

## 2014-09-12 NOTE — Patient Instructions (Signed)
Continue to work on diet and exercise. Continue same medications.

## 2014-09-12 NOTE — Progress Notes (Signed)
   Subjective:    Patient ID: Duncan DullSandra L Eisenhart, female    DOB: 11-12-1950, 64 y.o.   MRN: [id number]  HPI In today for three-month follow-up on hyperlipidemia and hypertension. Blood pressure is excellent 126/76. Since I last saw her she's been to [place]. Has a number of upcoming trips. She has a history of type 2 diabetes mellitus. Hemoglobin A1c is increased from 6.1 to  6.3%. Triglycerides have increased from 90-175. Weight has increased 1.5 pounds from January.  Actually she's not doing all that badly except for the elevation in triglycerides which is new. Most likely related to alcohol consumption, lack of exercise, impaired glucose tolerance. She's frustrated once again that she has not lost weight.  Review of Systems     Objective:   Physical Exam  Skin warm and dry. Nodes none. Neck is supple without JVD thyromegaly or carotid bruits. Chest clear to auscultation cardiac exam regular rate and rhythm normal S1 and S2. Extremities without pitting edema.     Assessment & Plan:  Hyperlipidemia-elevated triglycerides since last office visit in January. Work on diet and exercise. Recheck in 4 months.  Type 2 diabetes mellitus-hemoglobin A1c increased from 6.1% to 6.3% of the past 4 months. Continue to watch diet.  Obesity  Metabolic syndrome  Hypertension-stable on current regimen and acceptable  Plan: Return in August for follow-up. We'll have lipid panel and hemoglobin A1c at that time.

## 2014-10-31 ENCOUNTER — Other Ambulatory Visit: Payer: Self-pay | Admitting: Internal Medicine

## 2014-11-14 ENCOUNTER — Other Ambulatory Visit: Payer: BLUE CROSS/BLUE SHIELD | Admitting: Internal Medicine

## 2014-11-14 DIAGNOSIS — E119 Type 2 diabetes mellitus without complications: Secondary | ICD-10-CM

## 2014-11-14 DIAGNOSIS — E785 Hyperlipidemia, unspecified: Secondary | ICD-10-CM

## 2014-11-14 LAB — LIPID PANEL
CHOL/HDL RATIO: 2.9 ratio (ref <=5.0)
CHOLESTEROL: 155 mg/dL (ref 125–200)
HDL: 53 mg/dL (ref >=46)
LDL Cholesterol: 71 mg/dL (ref <130)
TRIGLYCERIDES: 157 mg/dL — AB (ref <150)
VLDL: 31 mg/dL — AB (ref <30)

## 2014-11-14 LAB — HEMOGLOBIN A1C
Hgb A1c MFr Bld: 6.1 % — ABNORMAL HIGH (ref <5.7)
MEAN PLASMA GLUCOSE: 128 mg/dL — AB (ref <117)

## 2014-11-16 ENCOUNTER — Ambulatory Visit (INDEPENDENT_AMBULATORY_CARE_PROVIDER_SITE_OTHER): Payer: BLUE CROSS/BLUE SHIELD | Admitting: Internal Medicine

## 2014-11-16 ENCOUNTER — Encounter: Payer: Self-pay | Admitting: Internal Medicine

## 2014-11-16 VITALS — BP 116/74 | HR 88 | Temp 97.9°F | Wt 166.0 lb

## 2014-11-16 DIAGNOSIS — E669 Obesity, unspecified: Secondary | ICD-10-CM | POA: Diagnosis not present

## 2014-11-16 DIAGNOSIS — E785 Hyperlipidemia, unspecified: Secondary | ICD-10-CM | POA: Diagnosis not present

## 2014-11-16 DIAGNOSIS — E8881 Metabolic syndrome: Secondary | ICD-10-CM | POA: Diagnosis not present

## 2014-11-16 DIAGNOSIS — I10 Essential (primary) hypertension: Secondary | ICD-10-CM | POA: Diagnosis not present

## 2014-11-16 DIAGNOSIS — R7302 Impaired glucose tolerance (oral): Secondary | ICD-10-CM | POA: Diagnosis not present

## 2014-11-16 NOTE — Patient Instructions (Addendum)
Come for CPE late January 2017. I am pleased with your progress. Please continue diet and weight loss and exercise efforts.

## 2014-12-15 ENCOUNTER — Other Ambulatory Visit: Payer: Self-pay

## 2014-12-15 DIAGNOSIS — Z1231 Encounter for screening mammogram for malignant neoplasm of breast: Secondary | ICD-10-CM

## 2015-01-09 ENCOUNTER — Ambulatory Visit
Admission: RE | Admit: 2015-01-09 | Discharge: 2015-01-09 | Disposition: A | Discharge status: Home / Self Care | Payer: BLUE CROSS/BLUE SHIELD | Source: Ambulatory Visit

## 2015-01-09 DIAGNOSIS — Z1231 Encounter for screening mammogram for malignant neoplasm of breast: Secondary | ICD-10-CM

## 2015-01-11 ENCOUNTER — Other Ambulatory Visit: Payer: Self-pay | Admitting: Internal Medicine

## 2015-01-11 DIAGNOSIS — R928 Other abnormal and inconclusive findings on diagnostic imaging of breast: Secondary | ICD-10-CM

## 2015-01-17 ENCOUNTER — Ambulatory Visit
Admission: RE | Admit: 2015-01-17 | Discharge: 2015-01-17 | Disposition: A | Discharge status: Home / Self Care | Payer: BLUE CROSS/BLUE SHIELD | Source: Ambulatory Visit | Attending: Internal Medicine | Admitting: Internal Medicine

## 2015-01-17 DIAGNOSIS — R928 Other abnormal and inconclusive findings on diagnostic imaging of breast: Secondary | ICD-10-CM

## 2015-03-04 DIAGNOSIS — R7302 Impaired glucose tolerance (oral): Secondary | ICD-10-CM | POA: Insufficient documentation

## 2015-03-04 DIAGNOSIS — E8881 Metabolic syndrome: Secondary | ICD-10-CM | POA: Insufficient documentation

## 2015-03-04 NOTE — Progress Notes (Signed)
   Subjective:    Patient ID: Gwendolyn Herman, female    DOB: [birth year], 64 y.o.   MRN: [id number]  HPI She's lost 6 pounds since her last visit which is good. She's here today to follow-up on hypertension and hyperlipidemia. Blood pressures excellent at 116/74. She feels well.  3 months ago hemoglobin A1c was 6.3% and is now 6.1%. 3 months ago liver panel was normal so this was not repeated. Also at that time total cholesterol was 196 and is now 155. Triglycerides were 175 and are now 157. LDL cholesterol was 107 and is now 71. She has responded well to diet exercise and weight loss.    Review of Systems     Objective:   Physical Exam  Skin warm and dry. Nodes none. Neck is supple without JVD thyromegaly or carotid bruits. Chest clear to auscultation. Cardiac exam regular rate and rhythm. Extremities without edema.      Assessment & Plan:  Essential hypertension-stable and under good control  Hyperlipidemia-lipid panel improved  Obesity -has lost 6 pounds since last visit which is encouraging  Plan: Continue diet exercise and weight loss regimen and return in late January for physical examination and lab work. Continue same medications. No changes made today

## 2015-03-05 ENCOUNTER — Other Ambulatory Visit: Payer: Self-pay | Admitting: Internal Medicine

## 2015-05-08 ENCOUNTER — Other Ambulatory Visit: Payer: BLUE CROSS/BLUE SHIELD | Admitting: Internal Medicine

## 2015-05-08 DIAGNOSIS — Z79899 Other long term (current) drug therapy: Secondary | ICD-10-CM

## 2015-05-08 DIAGNOSIS — I10 Essential (primary) hypertension: Secondary | ICD-10-CM

## 2015-05-08 DIAGNOSIS — Z1329 Encounter for screening for other suspected endocrine disorder: Secondary | ICD-10-CM

## 2015-05-08 DIAGNOSIS — E669 Obesity, unspecified: Secondary | ICD-10-CM

## 2015-05-08 DIAGNOSIS — Z0000 Encounter for general adult medical examination without abnormal findings: Secondary | ICD-10-CM

## 2015-05-08 DIAGNOSIS — E785 Hyperlipidemia, unspecified: Secondary | ICD-10-CM

## 2015-05-08 DIAGNOSIS — Z1321 Encounter for screening for nutritional disorder: Secondary | ICD-10-CM

## 2015-05-08 DIAGNOSIS — E119 Type 2 diabetes mellitus without complications: Secondary | ICD-10-CM

## 2015-05-08 LAB — CBC WITH DIFFERENTIAL/PLATELET
Basophils Absolute: 0 10*3/uL (ref 0.0–0.1)
Basophils Relative: 1 % (ref 0–1)
Eosinophils Absolute: 0.2 10*3/uL (ref 0.0–0.7)
Eosinophils Relative: 4 % (ref 0–5)
HEMATOCRIT: 40.9 % (ref 36.0–46.0)
HEMOGLOBIN: 13.6 g/dL (ref 12.0–15.0)
LYMPHS ABS: 2.1 10*3/uL (ref 0.7–4.0)
LYMPHS PCT: 49 % — AB (ref 12–46)
MCH: 31.1 pg (ref 26.0–34.0)
MCHC: 33.3 g/dL (ref 30.0–36.0)
MCV: 93.6 fL (ref 78.0–100.0)
MONO ABS: 0.3 10*3/uL (ref 0.1–1.0)
MONOS PCT: 8 % (ref 3–12)
MPV: 9.6 fL (ref 8.6–12.4)
NEUTROS ABS: 1.6 10*3/uL — AB (ref 1.7–7.7)
Neutrophils Relative %: 38 % — ABNORMAL LOW (ref 43–77)
Platelets: 313 10*3/uL (ref 150–400)
RBC: 4.37 MIL/uL (ref 3.87–5.11)
RDW: 13.1 % (ref 11.5–15.5)
WBC: 4.3 10*3/uL (ref 4.0–10.5)

## 2015-05-08 LAB — COMPLETE METABOLIC PANEL WITH GFR
ALBUMIN: 4.1 g/dL (ref 3.6–5.1)
ALK PHOS: 58 U/L (ref 33–130)
ALT: 24 U/L (ref 6–29)
AST: 23 U/L (ref 10–35)
BILIRUBIN TOTAL: 0.6 mg/dL (ref 0.2–1.2)
BUN: 20 mg/dL (ref 7–25)
CALCIUM: 9.3 mg/dL (ref 8.6–10.4)
CO2: 29 mmol/L (ref 20–31)
Chloride: 102 mmol/L (ref 98–110)
Creat: 0.77 mg/dL (ref 0.50–0.99)
GFR, EST NON AFRICAN AMERICAN: 82 mL/min (ref >=60)
GFR, Est African American: >89 mL/min (ref >=60)
Glucose, Bld: 108 mg/dL — ABNORMAL HIGH (ref 65–99)
POTASSIUM: 4.4 mmol/L (ref 3.5–5.3)
Sodium: 138 mmol/L (ref 135–146)
TOTAL PROTEIN: 6.7 g/dL (ref 6.1–8.1)

## 2015-05-08 LAB — LIPID PANEL
Cholesterol: 201 mg/dL — ABNORMAL HIGH (ref 125–200)
HDL: 64 mg/dL (ref >=46)
LDL CALC: 110 mg/dL (ref <130)
TRIGLYCERIDES: 133 mg/dL (ref <150)
Total CHOL/HDL Ratio: 3.1 Ratio (ref <=5.0)
VLDL: 27 mg/dL (ref <30)

## 2015-05-08 LAB — TSH: TSH: 1.55 u[IU]/mL (ref 0.350–4.500)

## 2015-05-09 LAB — HEMOGLOBIN A1C
HEMOGLOBIN A1C: 6.1 % — AB (ref <5.7)
Mean Plasma Glucose: 128 mg/dL — ABNORMAL HIGH (ref <117)

## 2015-05-09 LAB — VITAMIN D 25 HYDROXY (VIT D DEFICIENCY, FRACTURES): VIT D 25 HYDROXY: 37 ng/mL (ref 30–100)

## 2015-05-10 ENCOUNTER — Other Ambulatory Visit (HOSPITAL_COMMUNITY)
Admission: RE | Admit: 2015-05-10 | Discharge: 2015-05-10 | Disposition: A | Discharge status: Home / Self Care | Payer: BLUE CROSS/BLUE SHIELD | Source: Ambulatory Visit | Attending: Internal Medicine | Admitting: Internal Medicine

## 2015-05-10 ENCOUNTER — Other Ambulatory Visit: Payer: Self-pay | Admitting: Internal Medicine

## 2015-05-10 ENCOUNTER — Ambulatory Visit (INDEPENDENT_AMBULATORY_CARE_PROVIDER_SITE_OTHER): Payer: BLUE CROSS/BLUE SHIELD | Admitting: Internal Medicine

## 2015-05-10 ENCOUNTER — Encounter: Payer: Self-pay | Admitting: Internal Medicine

## 2015-05-10 VITALS — BP 108/68 | HR 78 | Temp 97.9°F | Resp 20 | Ht 62.0 in | Wt 166.0 lb

## 2015-05-10 DIAGNOSIS — R7302 Impaired glucose tolerance (oral): Secondary | ICD-10-CM | POA: Diagnosis not present

## 2015-05-10 DIAGNOSIS — Z01419 Encounter for gynecological examination (general) (routine) without abnormal findings: Secondary | ICD-10-CM | POA: Insufficient documentation

## 2015-05-10 DIAGNOSIS — Z0000 Encounter for general adult medical examination without abnormal findings: Secondary | ICD-10-CM | POA: Diagnosis not present

## 2015-05-10 DIAGNOSIS — E8881 Metabolic syndrome: Secondary | ICD-10-CM

## 2015-05-10 DIAGNOSIS — I10 Essential (primary) hypertension: Secondary | ICD-10-CM | POA: Diagnosis not present

## 2015-05-10 DIAGNOSIS — E669 Obesity, unspecified: Secondary | ICD-10-CM

## 2015-05-10 DIAGNOSIS — Z124 Encounter for screening for malignant neoplasm of cervix: Secondary | ICD-10-CM

## 2015-05-10 DIAGNOSIS — N951 Menopausal and female climacteric states: Secondary | ICD-10-CM | POA: Diagnosis not present

## 2015-05-10 DIAGNOSIS — E785 Hyperlipidemia, unspecified: Secondary | ICD-10-CM

## 2015-05-10 LAB — POCT URINALYSIS DIPSTICK
Bilirubin, UA: NEGATIVE
Blood, UA: NEGATIVE
Glucose, UA: NEGATIVE
KETONES UA: NEGATIVE
LEUKOCYTES UA: NEGATIVE
Nitrite, UA: NEGATIVE
PROTEIN UA: NEGATIVE
Spec Grav, UA: 1.015
Urobilinogen, UA: 0.2
pH, UA: 7

## 2015-05-10 NOTE — Patient Instructions (Addendum)
Have bone density study. Return in 6 months. Continue same medications. It was pleasure to see you today.

## 2015-05-14 LAB — CYTOLOGY - PAP

## 2015-06-12 ENCOUNTER — Other Ambulatory Visit: Payer: Self-pay | Admitting: Internal Medicine

## 2015-06-12 DIAGNOSIS — R921 Mammographic calcification found on diagnostic imaging of breast: Secondary | ICD-10-CM

## 2015-06-12 DIAGNOSIS — N63 Unspecified lump in unspecified breast: Secondary | ICD-10-CM

## 2015-06-12 NOTE — Progress Notes (Signed)
   Subjective:    Patient ID: Gwendolyn Herman, female    DOB: October 29, 1950, 65 y.o.   MRN: [id number]  HPI 65 year old Female in today for health maintenance exam and evaluation of medical problems. She has a history of hyperlipidemia and hypertension and impaired glucose tolerance.  Past medical history: Fractured right foot 1972, history of bilateral carpal tunnel syndrome, history of occasional issues with anxiety. She has a history of systolic murmur. History of 2-D echocardiogram in 08-19-2003 was normal. Was started on losartan May 2011 for hypertension. She was on Zocor for hyperlipidemia and then was switched to Lipitor in August 2011.  Family history: Father died in 08-18-2001 in his sleep at age 18. Mother died in 28 with history of congestive heart failure. One sister with history of hypertension and hyperlipidemia.  Social history: She is married to a Warden/ranger. Husband operates [employer]. They travel quite a bit with his lecturing. She does not smoke. Social I'll call consumption. She exercises regularly. Has adult children.    Review of Systems  Constitutional: Negative.   All other systems reviewed and are negative.      Objective:   Physical Exam  Constitutional: She is oriented to person, place, and time. She appears well-developed and well-nourished. She appears distressed.  HENT:  Head: Normocephalic and atraumatic.  Right Ear: External ear normal.  Left Ear: External ear normal.  Mouth/Throat: Oropharynx is clear and moist.  Eyes: Conjunctivae are normal. Pupils are equal, round, and reactive to light. Right eye exhibits no discharge. Left eye exhibits no discharge. No scleral icterus.  Neck: Neck supple. No JVD present. No thyromegaly present.  Cardiovascular: Normal rate and regular rhythm.   1/6 systolic ejection murmur  Pulmonary/Chest: Effort normal and breath sounds normal. She has no wheezes. She has no rales.  Abdominal: She exhibits no distension and no  mass. There is no rebound.  Genitourinary:  Pap taken. Bimanual normal.  Musculoskeletal: She exhibits no edema.  Lymphadenopathy:    She has no cervical adenopathy.  Neurological: She is alert and oriented to person, place, and time. She has normal reflexes. No cranial nerve deficit. Coordination normal.  Skin: Skin is warm and dry. No rash noted. She is not diaphoretic.  Psychiatric: She has a normal mood and affect. Her behavior is normal. Judgment and thought content normal.  Vitals reviewed.         Assessment & Plan:  Essential hypertension-stable  Hyperlipidemia-lipid panel normal  In  Glucose tolerance-hemoglobin A1c 6.1%  Mild obesity  Plan: I am pleased with her progress at this point in time. Return in 6 months or as needed. Continue same medications.

## 2015-07-05 ENCOUNTER — Ambulatory Visit
Admission: RE | Admit: 2015-07-05 | Discharge: 2015-07-05 | Disposition: A | Discharge status: Home / Self Care | Payer: BLUE CROSS/BLUE SHIELD | Source: Ambulatory Visit | Attending: Internal Medicine | Admitting: Internal Medicine

## 2015-07-05 DIAGNOSIS — N951 Menopausal and female climacteric states: Secondary | ICD-10-CM

## 2015-07-05 DIAGNOSIS — R921 Mammographic calcification found on diagnostic imaging of breast: Secondary | ICD-10-CM

## 2015-07-12 ENCOUNTER — Other Ambulatory Visit: Payer: Self-pay | Admitting: Internal Medicine

## 2015-07-12 NOTE — Telephone Encounter (Signed)
Phoned to pharmacy 

## 2015-07-12 NOTE — Telephone Encounter (Signed)
Refill x 3 months 

## 2015-08-06 ENCOUNTER — Other Ambulatory Visit: Payer: Self-pay | Admitting: Internal Medicine

## 2015-08-09 ENCOUNTER — Other Ambulatory Visit: Payer: Self-pay

## 2015-08-09 MED ORDER — METFORMIN HCL 500 MG PO TABS: 500.0000 mg | ORAL_TABLET | Freq: Two times a day (BID) | ORAL | Status: DC

## 2015-09-15 ENCOUNTER — Other Ambulatory Visit: Payer: Self-pay | Admitting: Internal Medicine

## 2015-11-01 ENCOUNTER — Other Ambulatory Visit: Payer: BLUE CROSS/BLUE SHIELD | Admitting: Internal Medicine

## 2015-11-01 DIAGNOSIS — E119 Type 2 diabetes mellitus without complications: Secondary | ICD-10-CM

## 2015-11-01 DIAGNOSIS — Z79899 Other long term (current) drug therapy: Secondary | ICD-10-CM

## 2015-11-01 DIAGNOSIS — E785 Hyperlipidemia, unspecified: Secondary | ICD-10-CM

## 2015-11-01 LAB — LIPID PANEL
Cholesterol: 181 mg/dL (ref 125–200)
HDL: 70 mg/dL (ref >=46)
LDL Cholesterol: 89 mg/dL (ref <130)
TRIGLYCERIDES: 110 mg/dL (ref <150)
Total CHOL/HDL Ratio: 2.6 Ratio (ref <=5.0)
VLDL: 22 mg/dL (ref <30)

## 2015-11-01 LAB — HEPATIC FUNCTION PANEL
ALK PHOS: 54 U/L (ref 33–130)
ALT: 33 U/L — ABNORMAL HIGH (ref 6–29)
AST: 30 U/L (ref 10–35)
Albumin: 4.4 g/dL (ref 3.6–5.1)
BILIRUBIN DIRECT: 0.1 mg/dL (ref <=0.2)
BILIRUBIN INDIRECT: 1 mg/dL (ref 0.2–1.2)
TOTAL PROTEIN: 7 g/dL (ref 6.1–8.1)
Total Bilirubin: 1.1 mg/dL (ref 0.2–1.2)

## 2015-11-02 LAB — HEMOGLOBIN A1C
Hgb A1c MFr Bld: 6 % — ABNORMAL HIGH (ref <5.7)
MEAN PLASMA GLUCOSE: 126 mg/dL

## 2015-11-06 ENCOUNTER — Ambulatory Visit (INDEPENDENT_AMBULATORY_CARE_PROVIDER_SITE_OTHER): Payer: BLUE CROSS/BLUE SHIELD | Admitting: Internal Medicine

## 2015-11-06 ENCOUNTER — Encounter: Payer: Self-pay | Admitting: Internal Medicine

## 2015-11-06 VITALS — BP 106/70 | HR 80 | Temp 97.5°F | Ht 62.0 in | Wt 168.0 lb

## 2015-11-06 DIAGNOSIS — E785 Hyperlipidemia, unspecified: Secondary | ICD-10-CM

## 2015-11-06 DIAGNOSIS — R7302 Impaired glucose tolerance (oral): Secondary | ICD-10-CM | POA: Diagnosis not present

## 2015-11-06 DIAGNOSIS — E669 Obesity, unspecified: Secondary | ICD-10-CM

## 2015-11-06 DIAGNOSIS — E8881 Metabolic syndrome: Secondary | ICD-10-CM

## 2015-11-06 DIAGNOSIS — I10 Essential (primary) hypertension: Secondary | ICD-10-CM

## 2015-11-06 DIAGNOSIS — M542 Cervicalgia: Secondary | ICD-10-CM

## 2015-11-06 LAB — POCT URINALYSIS DIPSTICK
Bilirubin, UA: NEGATIVE
Blood, UA: NEGATIVE
Glucose, UA: NEGATIVE
Ketones, UA: NEGATIVE
LEUKOCYTES UA: NEGATIVE
NITRITE UA: NEGATIVE
PROTEIN UA: NEGATIVE
SPEC GRAV UA: 1.025
UROBILINOGEN UA: 0.2
pH, UA: 5

## 2015-11-06 MED ORDER — CYCLOBENZAPRINE HCL 10 MG PO TABS: 10.0000 mg | ORAL_TABLET | Freq: Three times a day (TID) | ORAL | 0 refills | Status: DC | PRN

## 2015-11-06 NOTE — Progress Notes (Signed)
   Subjective:    Patient ID: Gwendolyn Herman, female    DOB: [birth year], 65 y.o.   MRN: [id number]  HPI For 6 month recheck on HTN , hyperlipidemia,Impaired glucose tolerance, obesity. BP is excellent on current regimen. She continues to watch her weight and her diet.  Recently she drove back from the beach and developed acute left neck pain. She also drove to [place]. Dr. Roanna Epley is a friend of hers and gave her some Flexeril and a soft cervical collar which helped.    Review of Systems aside from neck pain, no new complaints     Objective:   Physical Exam Skin warm and dry. Nodes none. Neck is supple without JVD thyromegaly or carotid bruits. She has palpable left neck spasm in left SCM muscle. Chest is clear to auscultation without rales or wheezing. Cardiac exam regular rate and rhythm normal S1 and S2.       Assessment & Plan:  Essential hypertension-stable on losartan HCTZ and amlodipine  Obesity  Impaired glucose tolerance which is stable. Continue metformin.  Left neck pain-continue ice, Flexeril and soft cervical collar until resolved. Can go to physical therapy if needed.  Hyperlipidemia-stable on statin medication  Her lab work was reviewed with her and is  within normal limits. I'm pleased with her progress. Her weight is 168 pounds which  has increased 2 pounds since physical exam 6 months ago. Continue to work on diet and exercise efforts. Return in 6 months for physical exam.

## 2015-11-08 NOTE — Patient Instructions (Signed)
It was pleasure to see you today. Continue same medications and return in 6 months. Continue to work on diet and exercise efforts.

## 2015-12-11 ENCOUNTER — Other Ambulatory Visit: Payer: Self-pay | Admitting: Internal Medicine

## 2015-12-11 DIAGNOSIS — R921 Mammographic calcification found on diagnostic imaging of breast: Secondary | ICD-10-CM

## 2016-01-10 ENCOUNTER — Other Ambulatory Visit: Payer: Self-pay | Admitting: Internal Medicine

## 2016-01-10 ENCOUNTER — Ambulatory Visit
Admission: RE | Admit: 2016-01-10 | Discharge: 2016-01-10 | Disposition: A | Discharge status: Home / Self Care | Payer: BLUE CROSS/BLUE SHIELD | Source: Ambulatory Visit | Attending: Internal Medicine | Admitting: Internal Medicine

## 2016-01-10 DIAGNOSIS — R921 Mammographic calcification found on diagnostic imaging of breast: Secondary | ICD-10-CM

## 2016-01-15 ENCOUNTER — Ambulatory Visit
Admission: RE | Admit: 2016-01-15 | Discharge: 2016-01-15 | Disposition: A | Discharge status: Home / Self Care | Payer: BLUE CROSS/BLUE SHIELD | Source: Ambulatory Visit | Attending: Internal Medicine | Admitting: Internal Medicine

## 2016-01-15 ENCOUNTER — Other Ambulatory Visit: Payer: Self-pay | Admitting: Internal Medicine

## 2016-01-15 DIAGNOSIS — R921 Mammographic calcification found on diagnostic imaging of breast: Secondary | ICD-10-CM

## 2016-02-06 ENCOUNTER — Other Ambulatory Visit: Payer: Self-pay | Admitting: Internal Medicine

## 2016-02-15 ENCOUNTER — Other Ambulatory Visit: Payer: Self-pay | Admitting: Internal Medicine

## 2016-05-08 ENCOUNTER — Other Ambulatory Visit: Payer: BLUE CROSS/BLUE SHIELD | Admitting: Internal Medicine

## 2016-05-08 DIAGNOSIS — Z1329 Encounter for screening for other suspected endocrine disorder: Secondary | ICD-10-CM

## 2016-05-08 DIAGNOSIS — Z1321 Encounter for screening for nutritional disorder: Secondary | ICD-10-CM

## 2016-05-08 DIAGNOSIS — E119 Type 2 diabetes mellitus without complications: Secondary | ICD-10-CM

## 2016-05-08 DIAGNOSIS — E7849 Other hyperlipidemia: Secondary | ICD-10-CM

## 2016-05-08 LAB — CBC WITH DIFFERENTIAL/PLATELET
Basophils Absolute: 46 cells/uL (ref 0–200)
Basophils Relative: 1 %
EOS PCT: 4 %
Eosinophils Absolute: 184 cells/uL (ref 15–500)
HEMATOCRIT: 43.3 % (ref 35.0–45.0)
HEMOGLOBIN: 14.4 g/dL (ref 11.7–15.5)
LYMPHS ABS: 1978 {cells}/uL (ref 850–3900)
Lymphocytes Relative: 43 %
MCH: 31.2 pg (ref 27.0–33.0)
MCHC: 33.3 g/dL (ref 32.0–36.0)
MCV: 93.7 fL (ref 80.0–100.0)
MONO ABS: 414 {cells}/uL (ref 200–950)
MPV: 9.3 fL (ref 7.5–12.5)
Monocytes Relative: 9 %
NEUTROS ABS: 1978 {cells}/uL (ref 1500–7800)
NEUTROS PCT: 43 %
Platelets: 295 10*3/uL (ref 140–400)
RBC: 4.62 MIL/uL (ref 3.80–5.10)
RDW: 13.2 % (ref 11.0–15.0)
WBC: 4.6 10*3/uL (ref 3.8–10.8)

## 2016-05-08 LAB — LIPID PANEL
CHOLESTEROL: 189 mg/dL (ref <200)
HDL: 68 mg/dL (ref >50)
LDL Cholesterol: 103 mg/dL — ABNORMAL HIGH (ref <100)
TRIGLYCERIDES: 90 mg/dL (ref <150)
Total CHOL/HDL Ratio: 2.8 Ratio (ref <5.0)
VLDL: 18 mg/dL (ref <30)

## 2016-05-08 LAB — COMPREHENSIVE METABOLIC PANEL
ALBUMIN: 4.3 g/dL (ref 3.6–5.1)
ALT: 37 U/L — ABNORMAL HIGH (ref 6–29)
AST: 30 U/L (ref 10–35)
Alkaline Phosphatase: 62 U/L (ref 33–130)
BILIRUBIN TOTAL: 0.7 mg/dL (ref 0.2–1.2)
BUN: 22 mg/dL (ref 7–25)
CALCIUM: 9.7 mg/dL (ref 8.6–10.4)
CHLORIDE: 104 mmol/L (ref 98–110)
CO2: 25 mmol/L (ref 20–31)
Creat: 0.76 mg/dL (ref 0.50–0.99)
Glucose, Bld: 113 mg/dL — ABNORMAL HIGH (ref 65–99)
Potassium: 4.4 mmol/L (ref 3.5–5.3)
SODIUM: 139 mmol/L (ref 135–146)
TOTAL PROTEIN: 7.2 g/dL (ref 6.1–8.1)

## 2016-05-08 LAB — TSH: TSH: 1.96 mIU/L

## 2016-05-09 LAB — MICROALBUMIN / CREATININE URINE RATIO
CREATININE, URINE: 231 mg/dL (ref 20–320)
MICROALB UR: 0.9 mg/dL
Microalb Creat Ratio: 4 mcg/mg creat (ref <30)

## 2016-05-09 LAB — VITAMIN D 25 HYDROXY (VIT D DEFICIENCY, FRACTURES): Vit D, 25-Hydroxy: 55 ng/mL (ref 30–100)

## 2016-05-09 LAB — HEMOGLOBIN A1C
HEMOGLOBIN A1C: 5.7 % — AB (ref <5.7)
MEAN PLASMA GLUCOSE: 117 mg/dL

## 2016-05-13 ENCOUNTER — Encounter: Payer: Self-pay | Admitting: Internal Medicine

## 2016-05-13 ENCOUNTER — Ambulatory Visit (INDEPENDENT_AMBULATORY_CARE_PROVIDER_SITE_OTHER): Payer: BLUE CROSS/BLUE SHIELD | Admitting: Internal Medicine

## 2016-05-13 VITALS — BP 110/84 | HR 86 | Temp 98.6°F | Ht 62.0 in | Wt 163.8 lb

## 2016-05-13 DIAGNOSIS — R8290 Unspecified abnormal findings in urine: Secondary | ICD-10-CM | POA: Diagnosis not present

## 2016-05-13 DIAGNOSIS — I10 Essential (primary) hypertension: Secondary | ICD-10-CM

## 2016-05-13 DIAGNOSIS — E785 Hyperlipidemia, unspecified: Secondary | ICD-10-CM | POA: Diagnosis not present

## 2016-05-13 DIAGNOSIS — R7302 Impaired glucose tolerance (oral): Secondary | ICD-10-CM | POA: Diagnosis not present

## 2016-05-13 DIAGNOSIS — Z0000 Encounter for general adult medical examination without abnormal findings: Secondary | ICD-10-CM | POA: Diagnosis not present

## 2016-05-13 LAB — POC URINALSYSI DIPSTICK (AUTOMATED)
Bilirubin, UA: NEGATIVE
GLUCOSE UA: NEGATIVE
KETONES UA: NEGATIVE
Nitrite, UA: NEGATIVE
PROTEIN UA: NEGATIVE
Urobilinogen, UA: NEGATIVE
pH, UA: 6

## 2016-05-13 NOTE — Progress Notes (Signed)
   Subjective:    Patient ID: Duncan DullSandra L Tungate, female    DOB: 1950-04-28, 66 y.o.   MRN: [id number]  HPI  66 year old Female for health maintenance exam and evaluation of medical issues.She has a history of impaired glucose tolerance, hypertension, hyperlipidemia.  Patient has cut back on drinking alcohol. She hardly is drinking at all at the present time.  Has been spending time at her beach house with some remodeling issues.  Past medical history: Fractured right foot 1972, history of bilateral carpal tunnel syndrome, history of occasional issues with anxiety. History of systolic murmur. Had 2-D echocardiogram in 2005 that was normal. Was started on losartan May 2011 for hypertension. She was on Zocor initially for hyperlipidemia and then was switched to Lipitor in August 2011.  Family history: Father died in 2003 in his sleep at age 65. Mother died in [id number] with history of congestive heart failure. One sister with history of hypertension and hyperlipidemia.  Social history: She is married to a Warden/rangerpsychologist. Husband operated [employer] but recently sold business to daughter and son-in-law. She had her husband are planning some interesting trips in the upcoming year. She does not smoke. Social alcohol consumption but none to speak of recently. She exercises regularly. Has adult children. Expecting another grandchild in the Spring.    Review of Systems  Constitutional: Negative.   All other systems reviewed and are negative.      Objective:   Physical Exam  Constitutional: She appears well-developed and well-nourished. No distress.  HENT:  Head: Normocephalic and atraumatic.  Right Ear: External ear normal.  Left Ear: External ear normal.  Mouth/Throat: Oropharynx is clear and moist. No oropharyngeal exudate.  Eyes: Conjunctivae and EOM are normal. Pupils are equal, round, and reactive to light. Right eye exhibits no discharge. Left eye exhibits no discharge. No scleral icterus.   Neck: Neck supple. No JVD present. No thyromegaly present.  Cardiovascular: Normal rate, regular rhythm, normal heart sounds and intact distal pulses.   No murmur heard. Pulmonary/Chest: Effort normal and breath sounds normal. No respiratory distress. She has no wheezes. She has no rales.  Abdominal: Soft. Bowel sounds are normal. She exhibits no distension and no mass. There is no tenderness. There is no rebound and no guarding.  Genitourinary:  Genitourinary Comments: Pat taken in January 2017. Bimanual normal today.  Musculoskeletal: She exhibits no edema.  Lymphadenopathy:    She has no cervical adenopathy.  Neurological: No cranial nerve deficit. Coordination normal.  Skin: Skin is warm and dry. No rash noted. She is not diaphoretic.  Psychiatric: She has a normal mood and affect. Judgment and thought content normal.  Vitals reviewed.         Assessment & Plan:  Hyperlipidemia-LDL is 103 otherwise normal on lipid medication  Impaired glucose tolerance-hemoglobin A1c  5.7% and previously was 6% when tested 6 months ago  Essential hypertension-stable on antihypertensive medication  Plan: I am pleased with progress and hemoglobin A1c. Continue diet and exercise regimen and return in 6 months.

## 2016-05-13 NOTE — Patient Instructions (Signed)
It was a pleasure to see you today. Continue diet and exercise regimen and return in 6 months. Keep up the good work.

## 2016-05-14 LAB — URINE CULTURE: Organism ID, Bacteria: NO GROWTH

## 2016-05-21 ENCOUNTER — Other Ambulatory Visit: Payer: Self-pay | Admitting: Internal Medicine

## 2016-05-22 ENCOUNTER — Other Ambulatory Visit: Payer: Self-pay | Admitting: Internal Medicine

## 2016-06-18 ENCOUNTER — Other Ambulatory Visit: Payer: Self-pay | Admitting: Internal Medicine

## 2016-08-21 ENCOUNTER — Other Ambulatory Visit: Payer: BLUE CROSS/BLUE SHIELD | Admitting: Internal Medicine

## 2016-09-29 DIAGNOSIS — R69 Illness, unspecified: Secondary | ICD-10-CM | POA: Diagnosis not present

## 2016-10-01 DIAGNOSIS — R69 Illness, unspecified: Secondary | ICD-10-CM | POA: Diagnosis not present

## 2016-10-21 ENCOUNTER — Other Ambulatory Visit: Payer: Medicare HMO | Admitting: Internal Medicine

## 2016-10-21 DIAGNOSIS — I10 Essential (primary) hypertension: Secondary | ICD-10-CM | POA: Diagnosis not present

## 2016-10-21 DIAGNOSIS — R7302 Impaired glucose tolerance (oral): Secondary | ICD-10-CM

## 2016-10-21 DIAGNOSIS — E785 Hyperlipidemia, unspecified: Secondary | ICD-10-CM | POA: Diagnosis not present

## 2016-10-22 LAB — LIPID PANEL
CHOLESTEROL: 191 mg/dL (ref <200)
HDL: 66 mg/dL (ref >50)
LDL Cholesterol: 101 mg/dL — ABNORMAL HIGH (ref <100)
TRIGLYCERIDES: 118 mg/dL (ref <150)
Total CHOL/HDL Ratio: 2.9 Ratio (ref <5.0)
VLDL: 24 mg/dL (ref <30)

## 2016-10-22 LAB — HEPATIC FUNCTION PANEL
ALBUMIN: 4 g/dL (ref 3.6–5.1)
ALT: 26 U/L (ref 6–29)
AST: 22 U/L (ref 10–35)
Alkaline Phosphatase: 59 U/L (ref 33–130)
BILIRUBIN DIRECT: 0.1 mg/dL (ref <=0.2)
BILIRUBIN TOTAL: 0.7 mg/dL (ref 0.2–1.2)
Indirect Bilirubin: 0.6 mg/dL (ref 0.2–1.2)
Total Protein: 6.6 g/dL (ref 6.1–8.1)

## 2016-10-22 LAB — HEMOGLOBIN A1C
Hgb A1c MFr Bld: 6 % — ABNORMAL HIGH (ref <5.7)
Mean Plasma Glucose: 126 mg/dL

## 2016-10-23 ENCOUNTER — Encounter: Payer: Self-pay | Admitting: Internal Medicine

## 2016-10-23 ENCOUNTER — Ambulatory Visit (INDEPENDENT_AMBULATORY_CARE_PROVIDER_SITE_OTHER): Payer: Medicare HMO | Admitting: Internal Medicine

## 2016-10-23 VITALS — BP 116/62 | HR 78 | Temp 98.3°F | Wt 170.0 lb

## 2016-10-23 DIAGNOSIS — E8881 Metabolic syndrome: Secondary | ICD-10-CM

## 2016-10-23 DIAGNOSIS — I10 Essential (primary) hypertension: Secondary | ICD-10-CM | POA: Diagnosis not present

## 2016-10-23 DIAGNOSIS — R7302 Impaired glucose tolerance (oral): Secondary | ICD-10-CM | POA: Diagnosis not present

## 2016-10-23 DIAGNOSIS — Z6831 Body mass index (BMI) 31.0-31.9, adult: Secondary | ICD-10-CM | POA: Diagnosis not present

## 2016-10-23 DIAGNOSIS — E785 Hyperlipidemia, unspecified: Secondary | ICD-10-CM | POA: Diagnosis not present

## 2016-10-23 NOTE — Patient Instructions (Signed)
It was a pleasure to see you today. Work on diet exercise and weight loss. Continue same medications and return in 6 months for welcome to Medicare physical exam.

## 2016-10-23 NOTE — Progress Notes (Signed)
   Subjective:    Patient ID: Gwendolyn Herman, female    DOB: 08-Jul-1950, 66 y.o.   MRN: [id number]  HPI 66 year old Female in today for six-month recheck on essential hypertension, hyperlipidemia, impaired glucose tolerance, obesity, metabolic syndrome. She's had a busy summer and has been traveling a bit. She's gained 7 pounds since January. Has been drinking some beer on some of these trips.  Her hemoglobin A1c is stable at 6%. 5 months ago hemoglobin A1c was 5.7%.  Her lipid panel is normal except for LDL of 101 which is better than it was 5 months ago at 103. Her liver functions are normal.    Review of Systems no new complaints-is frustrated with her weight gain     Objective:   Physical Exam Skin warm and dry. Nodes none. Neck is supple without JVD thyromegaly or carotid bruits. Chest clear to auscultation. Cardiac exam regular rate and rhythm normal S1 and S2.       Assessment & Plan:  Essential hypertension-stable on current regimen  Hyperlipidemia  Obesity-31.09  Metabolic syndrome  Hyperlipidemia  Impaired glucose tolerance  Her hemoglobin A1c has gone up 6% and she has gained 7 pounds. She needs to work on diet exercise and weight loss and continue current medications. Return in 6 months for welcome to Medicare physical exam.

## 2016-12-12 ENCOUNTER — Other Ambulatory Visit: Payer: Self-pay | Admitting: Internal Medicine

## 2016-12-12 DIAGNOSIS — R921 Mammographic calcification found on diagnostic imaging of breast: Secondary | ICD-10-CM

## 2017-01-20 ENCOUNTER — Ambulatory Visit
Admission: RE | Admit: 2017-01-20 | Discharge: 2017-01-20 | Disposition: A | Discharge status: Home / Self Care | Payer: Medicare HMO | Source: Ambulatory Visit | Attending: Internal Medicine | Admitting: Internal Medicine

## 2017-01-20 DIAGNOSIS — R921 Mammographic calcification found on diagnostic imaging of breast: Secondary | ICD-10-CM | POA: Diagnosis not present

## 2017-01-28 ENCOUNTER — Other Ambulatory Visit: Payer: Self-pay | Admitting: Internal Medicine

## 2017-02-04 DIAGNOSIS — R69 Illness, unspecified: Secondary | ICD-10-CM | POA: Diagnosis not present

## 2017-04-23 ENCOUNTER — Other Ambulatory Visit: Payer: Self-pay | Admitting: Internal Medicine

## 2017-04-23 DIAGNOSIS — Z1329 Encounter for screening for other suspected endocrine disorder: Secondary | ICD-10-CM

## 2017-04-23 DIAGNOSIS — E785 Hyperlipidemia, unspecified: Secondary | ICD-10-CM

## 2017-04-23 DIAGNOSIS — Z0000 Encounter for general adult medical examination without abnormal findings: Secondary | ICD-10-CM

## 2017-04-23 DIAGNOSIS — I10 Essential (primary) hypertension: Secondary | ICD-10-CM

## 2017-04-23 DIAGNOSIS — Z1321 Encounter for screening for nutritional disorder: Secondary | ICD-10-CM

## 2017-04-23 DIAGNOSIS — R7302 Impaired glucose tolerance (oral): Secondary | ICD-10-CM

## 2017-05-01 ENCOUNTER — Other Ambulatory Visit: Payer: Self-pay | Admitting: Internal Medicine

## 2017-05-01 NOTE — Telephone Encounter (Signed)
Refill once only. 

## 2017-05-07 ENCOUNTER — Other Ambulatory Visit: Payer: Self-pay

## 2017-05-07 NOTE — Telephone Encounter (Signed)
ERROR

## 2017-05-13 ENCOUNTER — Encounter: Payer: Self-pay | Admitting: Internal Medicine

## 2017-05-13 LAB — HM DIABETES EYE EXAM

## 2017-05-14 ENCOUNTER — Other Ambulatory Visit: Payer: PPO | Admitting: Internal Medicine

## 2017-05-14 DIAGNOSIS — Z0000 Encounter for general adult medical examination without abnormal findings: Secondary | ICD-10-CM

## 2017-05-14 DIAGNOSIS — R7302 Impaired glucose tolerance (oral): Secondary | ICD-10-CM | POA: Diagnosis not present

## 2017-05-14 DIAGNOSIS — I10 Essential (primary) hypertension: Secondary | ICD-10-CM | POA: Diagnosis not present

## 2017-05-14 DIAGNOSIS — E785 Hyperlipidemia, unspecified: Secondary | ICD-10-CM

## 2017-05-14 DIAGNOSIS — Z1321 Encounter for screening for nutritional disorder: Secondary | ICD-10-CM

## 2017-05-14 DIAGNOSIS — Z1329 Encounter for screening for other suspected endocrine disorder: Secondary | ICD-10-CM

## 2017-05-15 LAB — CBC WITH DIFFERENTIAL/PLATELET
BASOS PCT: 1 %
Basophils Absolute: 42 cells/uL (ref 0–200)
EOS PCT: 4.1 %
Eosinophils Absolute: 172 cells/uL (ref 15–500)
HEMATOCRIT: 38.6 % (ref 35.0–45.0)
HEMOGLOBIN: 13.5 g/dL (ref 11.7–15.5)
LYMPHS ABS: 1978 {cells}/uL (ref 850–3900)
MCH: 32 pg (ref 27.0–33.0)
MCHC: 35 g/dL (ref 32.0–36.0)
MCV: 91.5 fL (ref 80.0–100.0)
MPV: 10 fL (ref 7.5–12.5)
Monocytes Relative: 7.4 %
NEUTROS ABS: 1697 {cells}/uL (ref 1500–7800)
NEUTROS PCT: 40.4 %
Platelets: 272 10*3/uL (ref 140–400)
RBC: 4.22 10*6/uL (ref 3.80–5.10)
RDW: 12 % (ref 11.0–15.0)
Total Lymphocyte: 47.1 %
WBC: 4.2 10*3/uL (ref 3.8–10.8)
WBCMIX: 311 {cells}/uL (ref 200–950)

## 2017-05-15 LAB — COMPLETE METABOLIC PANEL WITH GFR
AG Ratio: 1.6 (calc) (ref 1.0–2.5)
ALBUMIN MSPROF: 4.1 g/dL (ref 3.6–5.1)
ALKALINE PHOSPHATASE (APISO): 57 U/L (ref 33–130)
ALT: 22 U/L (ref 6–29)
AST: 20 U/L (ref 10–35)
BILIRUBIN TOTAL: 0.6 mg/dL (ref 0.2–1.2)
BUN: 17 mg/dL (ref 7–25)
CHLORIDE: 107 mmol/L (ref 98–110)
CO2: 26 mmol/L (ref 20–32)
CREATININE: 0.74 mg/dL (ref 0.50–0.99)
Calcium: 9.4 mg/dL (ref 8.6–10.4)
GFR, Est African American: 99 mL/min/{1.73_m2} (ref >=60)
GFR, Est Non African American: 85 mL/min/{1.73_m2} (ref >=60)
GLUCOSE: 108 mg/dL — AB (ref 65–99)
Globulin: 2.5 g/dL (calc) (ref 1.9–3.7)
Potassium: 4.3 mmol/L (ref 3.5–5.3)
Sodium: 140 mmol/L (ref 135–146)
TOTAL PROTEIN: 6.6 g/dL (ref 6.1–8.1)

## 2017-05-15 LAB — TSH: TSH: 1.44 m[IU]/L (ref 0.40–4.50)

## 2017-05-15 LAB — LIPID PANEL
Cholesterol: 174 mg/dL (ref <200)
HDL: 68 mg/dL (ref >50)
LDL CHOLESTEROL (CALC): 79 mg/dL
NON-HDL CHOLESTEROL (CALC): 106 mg/dL (ref <130)
TRIGLYCERIDES: 171 mg/dL — AB (ref <150)
Total CHOL/HDL Ratio: 2.6 (calc) (ref <5.0)

## 2017-05-15 LAB — HEMOGLOBIN A1C
Hgb A1c MFr Bld: 6 % of total Hgb — ABNORMAL HIGH (ref <5.7)
MEAN PLASMA GLUCOSE: 126 (calc)
eAG (mmol/L): 7 (calc)

## 2017-05-15 LAB — VITAMIN D 25 HYDROXY (VIT D DEFICIENCY, FRACTURES): VIT D 25 HYDROXY: 52 ng/mL (ref 30–100)

## 2017-05-19 ENCOUNTER — Other Ambulatory Visit (HOSPITAL_COMMUNITY)
Admission: RE | Admit: 2017-05-19 | Discharge: 2017-05-19 | Disposition: A | Discharge status: Home / Self Care | Payer: PPO | Source: Ambulatory Visit | Attending: Internal Medicine | Admitting: Internal Medicine

## 2017-05-19 ENCOUNTER — Encounter: Payer: Self-pay | Admitting: Internal Medicine

## 2017-05-19 ENCOUNTER — Ambulatory Visit (INDEPENDENT_AMBULATORY_CARE_PROVIDER_SITE_OTHER): Payer: PPO | Admitting: Internal Medicine

## 2017-05-19 VITALS — BP 108/80 | HR 80 | Ht 62.25 in | Wt 171.5 lb

## 2017-05-19 DIAGNOSIS — Z124 Encounter for screening for malignant neoplasm of cervix: Secondary | ICD-10-CM | POA: Diagnosis not present

## 2017-05-19 DIAGNOSIS — E785 Hyperlipidemia, unspecified: Secondary | ICD-10-CM | POA: Diagnosis not present

## 2017-05-19 DIAGNOSIS — I10 Essential (primary) hypertension: Secondary | ICD-10-CM

## 2017-05-19 DIAGNOSIS — Z23 Encounter for immunization: Secondary | ICD-10-CM | POA: Diagnosis not present

## 2017-05-19 DIAGNOSIS — Z6831 Body mass index (BMI) 31.0-31.9, adult: Secondary | ICD-10-CM | POA: Diagnosis not present

## 2017-05-19 DIAGNOSIS — R7302 Impaired glucose tolerance (oral): Secondary | ICD-10-CM | POA: Diagnosis not present

## 2017-05-19 DIAGNOSIS — Z0000 Encounter for general adult medical examination without abnormal findings: Secondary | ICD-10-CM

## 2017-05-19 DIAGNOSIS — E8881 Metabolic syndrome: Secondary | ICD-10-CM

## 2017-05-19 LAB — POCT URINALYSIS DIPSTICK
Appearance: NORMAL
Bilirubin, UA: NEGATIVE
Glucose, UA: NEGATIVE
Ketones, UA: NEGATIVE
Leukocytes, UA: NEGATIVE
NITRITE UA: NEGATIVE
Odor: NORMAL
PROTEIN UA: NEGATIVE
SPEC GRAV UA: 1.015 (ref 1.010–1.025)
Urobilinogen, UA: 0.2 E.U./dL
pH, UA: 6 (ref 5.0–8.0)

## 2017-05-21 LAB — CYTOLOGY - PAP: Diagnosis: NEGATIVE

## 2017-06-05 ENCOUNTER — Other Ambulatory Visit: Payer: Self-pay | Admitting: Internal Medicine

## 2017-06-11 NOTE — Progress Notes (Signed)
Subjective:    Patient ID: Gwendolyn Herman, female    DOB: 05-11-1950, 67 y.o.   MRN: [id number]  HPI 67 year old Female in today for Welcome to Medicare physical exam and evaluation of medical issues.  She has a history of impaired glucose tolerance hypertension and hyperlipidemia.  Past medical history: Fractured right foot 1972, history of bilateral carpal tunnel syndrome, history of occasional issues with anxiety.  History of systolic murmur and had 2D echocardiogram in 09-09-2003 that was normal.  Was started on losartan in 05/28/2011for hypertension.  Is on Lipitor for hyperlipidemia which was started August 2011.  Family history: Father died in Sep 08, 2001 in his sleep at age 2.  Mother died in 35 with history of congestive heart failure.  One sister with history of hypertension and hyperlipidemia.  Social history: She is married to a Warden/ranger.  She and her husband travel quite a bit.  She does not smoke.  Social alcohol consumption.  When she drinks less her hemoglobin A1c improves.  She has adult children.  She has grandchildren.      Review of Systems  Constitutional: Negative.   All other systems reviewed and are negative.      Objective:   Physical Exam  Constitutional: She appears well-developed and well-nourished. No distress.  HENT:  Head: Normocephalic and atraumatic.  Right Ear: External ear normal.  Left Ear: External ear normal.  Mouth/Throat: Oropharynx is clear and moist.  Eyes: Conjunctivae and EOM are normal. Pupils are equal, round, and reactive to light. Right eye exhibits no discharge. Left eye exhibits no discharge. No scleral icterus.  Neck: Neck supple. No JVD present. No thyromegaly present.  Cardiovascular: Normal rate, regular rhythm, normal heart sounds and intact distal pulses.  No murmur heard. Pulmonary/Chest: No respiratory distress. She has no wheezes. She has no rales. She exhibits no tenderness.  Breasts normal female without masses    Abdominal: Soft. Bowel sounds are normal. She exhibits no distension and no mass. There is no tenderness. There is no rebound and no guarding.  Genitourinary:  Genitourinary Comments: Pap taken.  Bimanual normal.  Musculoskeletal: She exhibits no edema.  Lymphadenopathy:    She has no cervical adenopathy.  Neurological: No cranial nerve deficit. Coordination normal.  Skin: Skin is warm and dry. No rash noted. She is not diaphoretic.  Psychiatric: She has a normal mood and affect. Her behavior is normal. Thought content normal.  Vitals reviewed.         Assessment & Plan:  Hyperlipidemia-triglycerides elevated at 171.  Watch alcohol consumption.  Impaired glucose tolerance-hemoglobin A1c stable at 6%  Essential hypertension-blood pressure excellent on current regimen  BMI 31.12  Plan: Continue current regimen.  Watch alcohol consumption.  Continue same medications and return in 6 months.  Subjective:   Patient presents for Medicare Annual/Subsequent preventive examination.  Review Past Medical/Family/Social: See above   Risk Factors  Current exercise habits: she gets quite a bit of exercise Dietary issues discussed: Low-fat low carbohydrate Cardiac risk factors: Hyperlipidemia, impaired glucose tolerance, hypertension, mother with congestive heart failure Depression Screen  (Note: if answer to either of the following is "Yes", a more complete depression screening is indicated)   Over the past two weeks, have you felt down, depressed or hopeless? No  Over the past two weeks, have you felt little interest or pleasure in doing things? No Have you lost interest or pleasure in daily life? No Do you often feel hopeless? No Do you cry easily  over simple problems? No   Activities of Daily Living  In your present state of health, do you have any difficulty performing the following activities?:   Driving? No  Managing money? No  Feeding yourself? No  Getting from bed to  chair? No  Climbing a flight of stairs? No  Preparing food and eating?: No  Bathing or showering? No  Getting dressed: No  Getting to the toilet? No  Using the toilet:No  Moving around from place to place: No  In the past year have you fallen or had a near fall?:  Yes Are you sexually active?  Yes Do you have more than one partner? No   Hearing Difficulties: No  Do you often ask people to speak up or repeat themselves? No  Do you experience ringing or noises in your ears? No  Do you have difficulty understanding soft or whispered voices? No  Do you feel that you have a problem with memory? No Do you often misplace items?  Yes   Home Safety:  Do you have a smoke alarm at your residence? Yes Do you have grab bars in the bathroom?  No Do you have throw rugs in your house? No  Cognitive Testing  Alert? Yes Normal Appearance?Yes  Oriented to person? Yes Place? Yes  Time? Yes  Recall of three objects? Yes  Can perform simple calculations? Yes  Displays appropriate judgment?Yes  Can read the correct time from a watch face?Yes   List the Names of Other Physician/Practitioners you currently use:  See referral list for the physicians patient is currently seeing.     Review of Systems: See above  Objective:     General appearance: Appears stated age and mildly obese  Head: Normocephalic, without obvious abnormality, atraumatic  Eyes: conj clear, EOMi PEERLA  Ears: normal TM's and external ear canals both ears  Nose: Nares normal. Septum midline. Mucosa normal. No drainage or sinus tenderness.  Throat: lips, mucosa, and tongue normal; teeth and gums normal  Neck: no adenopathy, no carotid bruit, no JVD, supple, symmetrical, trachea midline and thyroid not enlarged, symmetric, no tenderness/mass/nodules  No CVA tenderness.  Lungs: clear to auscultation bilaterally  Breasts: normal appearance, no masses or tenderness Heart: regular rate and rhythm, S1, S2 normal, no murmur,  click, rub or gallop  Abdomen: soft, non-tender; bowel sounds normal; no masses, no organomegaly  Musculoskeletal: ROM normal in all joints, no crepitus, no deformity, Normal muscle strengthen. Back  is symmetric, no curvature. Skin: Skin color, texture, turgor normal. No rashes or lesions  Lymph nodes: Cervical, supraclavicular, and axillary nodes normal.  Neurologic: CN 2 -12 Normal, Normal symmetric reflexes. Normal coordination and gait  Psych: Alert & Oriented x 3, Mood appear stable.    Assessment:    Annual wellness medicare exam   Plan:    During the course of the visit the patient was educated and counseled about appropriate screening and preventive services including:   Annual mammogram  Annual flu vaccine     Patient Instructions (the written plan) was given to the patient.  Medicare Attestation  I have personally reviewed:  The patient's medical and social history  Their use of alcohol, tobacco or illicit drugs  Their current medications and supplements  The patient's functional ability including ADLs,fall risks, home safety risks, cognitive, and hearing and visual impairment  Diet and physical activities  Evidence for depression or mood disorders  The patient's weight, height, BMI, and visual acuity have been recorded in  the chart. I have made referrals, counseling, and provided education to the patient based on review of the above and I have provided the patient with a written personalized care plan for preventive services.     With

## 2017-06-11 NOTE — Patient Instructions (Addendum)
It was a pleasure to see you today.  Continue same medications and return in 6 months.  Prevnar 13 given.

## 2017-07-06 ENCOUNTER — Ambulatory Visit (INDEPENDENT_AMBULATORY_CARE_PROVIDER_SITE_OTHER): Payer: PPO | Admitting: Internal Medicine

## 2017-07-06 ENCOUNTER — Encounter: Payer: Self-pay | Admitting: Internal Medicine

## 2017-07-06 DIAGNOSIS — Z0000 Encounter for general adult medical examination without abnormal findings: Secondary | ICD-10-CM

## 2017-07-06 NOTE — Progress Notes (Signed)
Welcome to Medicare EKG was inadvertently overlooked when pt was here last month for Welcome to Medicare exam. It was done today and is normal.

## 2017-07-06 NOTE — Patient Instructions (Signed)
Welcome to Medicare EKG done and is WNL.

## 2017-07-08 ENCOUNTER — Ambulatory Visit: Payer: PPO | Admitting: Internal Medicine

## 2017-07-28 ENCOUNTER — Other Ambulatory Visit: Payer: Self-pay | Admitting: Internal Medicine

## 2017-08-05 ENCOUNTER — Encounter: Payer: Self-pay | Admitting: Internal Medicine

## 2017-09-03 ENCOUNTER — Other Ambulatory Visit: Payer: Self-pay | Admitting: Internal Medicine

## 2017-10-30 ENCOUNTER — Other Ambulatory Visit: Payer: Self-pay | Admitting: Internal Medicine

## 2017-11-18 ENCOUNTER — Other Ambulatory Visit: Payer: Self-pay | Admitting: Internal Medicine

## 2017-11-18 DIAGNOSIS — E785 Hyperlipidemia, unspecified: Secondary | ICD-10-CM

## 2017-11-18 DIAGNOSIS — R7302 Impaired glucose tolerance (oral): Secondary | ICD-10-CM

## 2017-11-18 DIAGNOSIS — Z79899 Other long term (current) drug therapy: Secondary | ICD-10-CM

## 2017-11-18 DIAGNOSIS — Z5181 Encounter for therapeutic drug level monitoring: Secondary | ICD-10-CM

## 2017-11-19 ENCOUNTER — Other Ambulatory Visit: Payer: PPO | Admitting: Internal Medicine

## 2017-11-23 ENCOUNTER — Other Ambulatory Visit: Payer: PPO | Admitting: Internal Medicine

## 2017-11-23 DIAGNOSIS — E785 Hyperlipidemia, unspecified: Secondary | ICD-10-CM

## 2017-11-23 DIAGNOSIS — R7302 Impaired glucose tolerance (oral): Secondary | ICD-10-CM

## 2017-11-23 DIAGNOSIS — Z5181 Encounter for therapeutic drug level monitoring: Secondary | ICD-10-CM | POA: Diagnosis not present

## 2017-11-23 DIAGNOSIS — Z79899 Other long term (current) drug therapy: Secondary | ICD-10-CM

## 2017-11-24 ENCOUNTER — Encounter: Payer: Self-pay | Admitting: Internal Medicine

## 2017-11-24 ENCOUNTER — Ambulatory Visit (INDEPENDENT_AMBULATORY_CARE_PROVIDER_SITE_OTHER): Payer: PPO | Admitting: Internal Medicine

## 2017-11-24 VITALS — BP 128/80 | HR 88 | Ht 62.25 in | Wt 169.0 lb

## 2017-11-24 DIAGNOSIS — Z6830 Body mass index (BMI) 30.0-30.9, adult: Secondary | ICD-10-CM | POA: Diagnosis not present

## 2017-11-24 DIAGNOSIS — R7302 Impaired glucose tolerance (oral): Secondary | ICD-10-CM | POA: Diagnosis not present

## 2017-11-24 DIAGNOSIS — I10 Essential (primary) hypertension: Secondary | ICD-10-CM | POA: Diagnosis not present

## 2017-11-24 DIAGNOSIS — E782 Mixed hyperlipidemia: Secondary | ICD-10-CM

## 2017-11-24 LAB — LIPID PANEL
CHOL/HDL RATIO: 4 (calc) (ref <5.0)
CHOLESTEROL: 218 mg/dL — AB (ref <200)
HDL: 55 mg/dL (ref >50)
LDL CHOLESTEROL (CALC): 127 mg/dL — AB
Non-HDL Cholesterol (Calc): 163 mg/dL (calc) — ABNORMAL HIGH (ref <130)
Triglycerides: 216 mg/dL — ABNORMAL HIGH (ref <150)

## 2017-11-24 LAB — HEPATIC FUNCTION PANEL
AG RATIO: 1.6 (calc) (ref 1.0–2.5)
ALKALINE PHOSPHATASE (APISO): 68 U/L (ref 33–130)
ALT: 33 U/L — AB (ref 6–29)
AST: 26 U/L (ref 10–35)
Albumin: 4.2 g/dL (ref 3.6–5.1)
BILIRUBIN DIRECT: 0.1 mg/dL (ref 0.0–0.2)
Globulin: 2.6 g/dL (calc) (ref 1.9–3.7)
Indirect Bilirubin: 0.7 mg/dL (calc) (ref 0.2–1.2)
Total Bilirubin: 0.8 mg/dL (ref 0.2–1.2)
Total Protein: 6.8 g/dL (ref 6.1–8.1)

## 2017-11-24 LAB — HEMOGLOBIN A1C
Hgb A1c MFr Bld: 6 % of total Hgb — ABNORMAL HIGH (ref <5.7)
MEAN PLASMA GLUCOSE: 126 (calc)
eAG (mmol/L): 7 (calc)

## 2017-11-24 LAB — MICROALBUMIN / CREATININE URINE RATIO
Creatinine, Urine: 150 mg/dL (ref 20–275)
MICROALB UR: 1 mg/dL
Microalb Creat Ratio: 7 mcg/mg creat (ref <30)

## 2017-11-24 MED ORDER — ATORVASTATIN CALCIUM 80 MG PO TABS: 80.0000 mg | ORAL_TABLET | Freq: Every day | ORAL | 3 refills | Status: DC

## 2017-11-24 MED ORDER — ALPRAZOLAM 0.25 MG PO TABS: 0.2500 mg | ORAL_TABLET | Freq: Two times a day (BID) | ORAL | 0 refills | Status: DC | PRN

## 2017-11-24 NOTE — Progress Notes (Signed)
   Subjective:    Patient ID: Gwendolyn Herman, female    DOB: [birth year], 67 y.o.   MRN: [id number]  HPI 67 year old Female in today for 8360-month recheck.  Is been eating cheese quesadillas for breakfast.  She has stable hemoglobin A1c of 6% but lipids have increased.  Total cholesterol has increased from from 174 to 218.  Triglycerides have increased from 171 to 216.  LDL cholesterol has increased from 79 to 127.  She will increase Lipitor to 80 mg daily and follow-up in 3 months.  SGPT has increased from 22-33.  May be related to recent alcohol consumption.  Her blood pressure is stable on current regimen.  She is lost 2 pounds since last visit.    Review of Systems see above     Objective:   Physical Exam  She was not examined today but we spent 25 minutes speaking about these issues in her diet.  She is trying to exercise.      Assessment & Plan:  Hyperlipidemia-increase Lipitor to 80 mg daily and follow-up in 3 months  Essential hypertension-stable on current regimen  Asking about dermatologist.  Was given Dr. Reginia FortsMcConnell's name and phone number.  She wants a female.  Asking for refill on Xanax.  She takes this sparingly.  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  Recheck in 3 months.  Last reading 6 months ago was 22.  BMI 30.66 continue to work on diet exercise and weight loss

## 2017-11-24 NOTE — Patient Instructions (Signed)
Continue to work on diet exercise weight loss.  Watch alcohol consumption.  Increase Lipitor to 80 mg daily.  Follow-up in 3 months.  Xanax refilled.

## 2018-01-22 ENCOUNTER — Other Ambulatory Visit: Payer: Self-pay | Admitting: Internal Medicine

## 2018-01-22 DIAGNOSIS — Z1231 Encounter for screening mammogram for malignant neoplasm of breast: Secondary | ICD-10-CM

## 2018-01-29 ENCOUNTER — Other Ambulatory Visit: Payer: Self-pay | Admitting: Internal Medicine

## 2018-02-17 DIAGNOSIS — M654 Radial styloid tenosynovitis [de Quervain]: Secondary | ICD-10-CM | POA: Diagnosis not present

## 2018-02-24 ENCOUNTER — Ambulatory Visit
Admission: RE | Admit: 2018-02-24 | Discharge: 2018-02-24 | Disposition: A | Discharge status: Home / Self Care | Payer: PPO | Source: Ambulatory Visit | Attending: Internal Medicine | Admitting: Internal Medicine

## 2018-02-24 DIAGNOSIS — Z1231 Encounter for screening mammogram for malignant neoplasm of breast: Secondary | ICD-10-CM

## 2018-03-02 ENCOUNTER — Other Ambulatory Visit: Payer: PPO | Admitting: Internal Medicine

## 2018-03-02 ENCOUNTER — Ambulatory Visit (INDEPENDENT_AMBULATORY_CARE_PROVIDER_SITE_OTHER): Payer: PPO | Admitting: Internal Medicine

## 2018-03-02 DIAGNOSIS — Z79899 Other long term (current) drug therapy: Secondary | ICD-10-CM

## 2018-03-02 DIAGNOSIS — Z0189 Encounter for other specified special examinations: Secondary | ICD-10-CM

## 2018-03-02 DIAGNOSIS — I10 Essential (primary) hypertension: Secondary | ICD-10-CM | POA: Diagnosis not present

## 2018-03-02 DIAGNOSIS — Z5181 Encounter for therapeutic drug level monitoring: Secondary | ICD-10-CM | POA: Diagnosis not present

## 2018-03-02 DIAGNOSIS — E785 Hyperlipidemia, unspecified: Secondary | ICD-10-CM | POA: Diagnosis not present

## 2018-03-03 LAB — HEMOGLOBIN A1C
EAG (MMOL/L): 7.1 (calc)
Hgb A1c MFr Bld: 6.1 % of total Hgb — ABNORMAL HIGH (ref <5.7)
Mean Plasma Glucose: 128 (calc)

## 2018-03-03 LAB — LIPID PANEL
CHOL/HDL RATIO: 3 (calc) (ref <5.0)
Cholesterol: 120 mg/dL (ref <200)
HDL: 40 mg/dL — AB (ref >50)
LDL Cholesterol (Calc): 63 mg/dL (calc)
NON-HDL CHOLESTEROL (CALC): 80 mg/dL (ref <130)
Triglycerides: 89 mg/dL (ref <150)

## 2018-03-03 LAB — HEPATIC FUNCTION PANEL
AG Ratio: 1.7 (calc) (ref 1.0–2.5)
ALKALINE PHOSPHATASE (APISO): 62 U/L (ref 33–130)
ALT: 32 U/L — AB (ref 6–29)
AST: 26 U/L (ref 10–35)
Albumin: 3.9 g/dL (ref 3.6–5.1)
BILIRUBIN DIRECT: 0.2 mg/dL (ref 0.0–0.2)
Globulin: 2.3 g/dL (calc) (ref 1.9–3.7)
Indirect Bilirubin: 0.4 mg/dL (calc) (ref 0.2–1.2)
Total Bilirubin: 0.6 mg/dL (ref 0.2–1.2)
Total Protein: 6.2 g/dL (ref 6.1–8.1)

## 2018-03-04 ENCOUNTER — Encounter: Payer: Self-pay | Admitting: Internal Medicine

## 2018-03-04 ENCOUNTER — Ambulatory Visit (INDEPENDENT_AMBULATORY_CARE_PROVIDER_SITE_OTHER): Payer: PPO | Admitting: Internal Medicine

## 2018-03-04 VITALS — BP 110/80 | HR 70 | Ht 62.25 in | Wt 169.0 lb

## 2018-03-04 DIAGNOSIS — R7302 Impaired glucose tolerance (oral): Secondary | ICD-10-CM

## 2018-03-04 DIAGNOSIS — I10 Essential (primary) hypertension: Secondary | ICD-10-CM

## 2018-03-04 DIAGNOSIS — E782 Mixed hyperlipidemia: Secondary | ICD-10-CM | POA: Diagnosis not present

## 2018-03-04 NOTE — Patient Instructions (Addendum)
Pt to abstain from alcohol , NSAIDS, and Tylenol. RTC for liver functions in 4 weeks.  Otherwise she will have physical exam in March.  Continue same medications.

## 2018-03-04 NOTE — Progress Notes (Signed)
   Subjective:    Patient ID: Laruth BouchardSandra N Storer, female    DOB: July 25, 1950, 67 y.o.   MRN: [id number]  HPI 67 year old Female for six-month follow-up.  She has a history of hypertension hyperlipidemia metabolic syndrome and impaired glucose tolerance.  Continues to get a fair amount of exercise with walking and biking.  She and her husband are active together.  Total cholesterol was 218 when checked 3 months ago and is now 120.  Triglycerides were 216 and are now 89.  LDL cholesterol was 127 and is now 63.  She is on Lipitor 80 mg daily.  Remains on metformin 500 mg daily for impaired glucose tolerance.  Is on losartan HCTZ 100/25 and amlodipine for hypertension.  Hemoglobin A1c is 6.1% in 3 months ago was 6%.  At last visit her generic Lipitor was doubled.  She drinks alcohol socially.  Her liver functions on increased dose of Lipitor are stable.  She has a mild elevation of SGPT at 32 and had a reading of 33 3 months ago.  She is [place] new grand[place] the [place].  Her dog is been ill and that has been stressful.  She is been traveling some.  [place]    Review of Systems no new complaints. Has had left hand injury to tendons     Objective:   Physical Exam Neck is supple without JVD thyromegaly or carotid bruits.  Chest clear to auscultation.  Cardiac exam regular rate and rhythm normal S1 and S2.  Extremities without edema.  Blood pressure excellent 110/80 on current regimen       Assessment & Plan:  Essential hypertension-stable on current regimen  Impaired glucose tolerance-about the same as 3 months ago- continue metformin  Hyperlipidemia-responded well to increased dose of Lipitor.  Liver function stable.  Plan: She wants to recheck her liver panel in 4 weeks.  She will watch alcohol consumption.  Metabolic syndrome-continue diet and exercise efforts  We will plan to do her physical exam in late  March.

## 2018-03-13 ENCOUNTER — Encounter: Payer: Self-pay | Admitting: Internal Medicine

## 2018-03-13 NOTE — Patient Instructions (Addendum)
Return for follow-up appointment in 2 days.  Lab draw only today.

## 2018-03-17 DIAGNOSIS — M654 Radial styloid tenosynovitis [de Quervain]: Secondary | ICD-10-CM | POA: Diagnosis not present

## 2018-03-25 ENCOUNTER — Other Ambulatory Visit: Payer: PPO | Admitting: Internal Medicine

## 2018-03-25 DIAGNOSIS — R7989 Other specified abnormal findings of blood chemistry: Secondary | ICD-10-CM

## 2018-03-25 DIAGNOSIS — R945 Abnormal results of liver function studies: Principal | ICD-10-CM

## 2018-03-26 ENCOUNTER — Other Ambulatory Visit: Payer: PPO | Admitting: Internal Medicine

## 2018-03-26 LAB — HEPATIC FUNCTION PANEL
AG Ratio: 1.6 (calc) (ref 1.0–2.5)
ALBUMIN MSPROF: 4.5 g/dL (ref 3.6–5.1)
ALT: 29 U/L (ref 6–29)
AST: 27 U/L (ref 10–35)
Alkaline phosphatase (APISO): 74 U/L (ref 33–130)
Bilirubin, Direct: 0.2 mg/dL (ref 0.0–0.2)
GLOBULIN: 2.9 g/dL (ref 1.9–3.7)
Indirect Bilirubin: 0.7 mg/dL (calc) (ref 0.2–1.2)
TOTAL PROTEIN: 7.4 g/dL (ref 6.1–8.1)
Total Bilirubin: 0.9 mg/dL (ref 0.2–1.2)

## 2018-04-01 ENCOUNTER — Ambulatory Visit (INDEPENDENT_AMBULATORY_CARE_PROVIDER_SITE_OTHER): Payer: PPO | Admitting: Sports Medicine

## 2018-04-01 ENCOUNTER — Encounter: Payer: Self-pay | Admitting: Sports Medicine

## 2018-04-01 ENCOUNTER — Ambulatory Visit: Payer: Self-pay

## 2018-04-01 VITALS — BP 118/70 | Ht 62.0 in | Wt 164.0 lb

## 2018-04-01 DIAGNOSIS — M778 Other enthesopathies, not elsewhere classified: Secondary | ICD-10-CM | POA: Insufficient documentation

## 2018-04-01 DIAGNOSIS — M654 Radial styloid tenosynovitis [de Quervain]: Secondary | ICD-10-CM

## 2018-04-01 DIAGNOSIS — G5601 Carpal tunnel syndrome, right upper limb: Secondary | ICD-10-CM | POA: Diagnosis not present

## 2018-04-01 DIAGNOSIS — M25532 Pain in left wrist: Secondary | ICD-10-CM | POA: Diagnosis not present

## 2018-04-01 NOTE — Patient Instructions (Signed)
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  We recommend continuing to wear your wrist/thumb brace during activity that aggravates that area.  Should also allow your hand to get some rest the day and night. You can fill your prescription use your topical Voltaren gel 3-4 times per day symptom relief.  Also recommend rubber band exercises 1-2 daily strength in that area.  You have any questions or concerns, or the pain starts to worsen please call/return office.

## 2018-04-01 NOTE — Progress Notes (Signed)
  Gwendolyn Herman - 67 y.o. female MRN [id number]  Date of birth: February 10, 1951    SUBJECTIVE:      Chief Complaint:/ HPI:  Gwendolyn CampanileSandy is a 67 year old female who presents with left hand and thumb pain since Labor Day. She states that she woke up with pain one day that improved while wearing an Ace wrap. Pain persisted, and then weeks later she fell and pain worsened in that hand/thumb. She went to [facility] who recommended wearing a splint. X-ray at that time was negative for fracture. She wore the splint continuously, day and night for a month, with improvement in symptoms.  She was advised to reduce the time spent wearing the splint due to concern for the joint getting stiff. She present for evaluation because although improved, she states that the pain is still present and prevents her from picking up her dogs and grandchildren.   Denies numbness or weakness. No additional injuries. Has stopped wearing the splint "for some time."  ROS:     See HPI  PERTINENT  PMH / PSH FH / / SH:  Past Medical, Surgical, Social, and Family History Reviewed & Updated in the EMR.  Pertinent findings include:  PMH: History of bilateral carpal tunnel  OBJECTIVE: BP 118/70   Ht 5\' 2"  (1.575 m)   Wt 74.4 kg   BMI 30.00 kg/m   Physical Exam:  Vital signs are reviewed.  GEN: Alert and oriented, NAD Pulm: Breathing unlabored PSY: normal mood, congruent affect  MSK:  No obvious injury/deformity on external exam Slight increase in swelling on L>R Full range of motion of bilateral wrist and elbow joints Sensation intact 5/5 muscle strength  Mild tenderness on base on left thumb No bony tenderness on ulnar or radius No pain on radial and ulnar deviation with resistance + Finkelstein's test  Ultrasound Left wrist  Compartment 1 with bull's eye sign with extensive swelling on short axis and long axis Compartments 2 to 6 are normal Tendons are intact without tear No cortical abnormality of  distal radius or ulna  Scan of median nerves (long history of carpal tunnel) Increased volume on RT of 0.22cm2 and 0.18 cm 2 on LT with ULN of 0.12cm2  Impression:  Findings consistent with Dequervain's tenosynovitis on left;  Findings of median nerve enlargement bilaterally.  I observed and examined the patient with the resident and agree with assessment and plan.  Note reviewed and modified by me. Gwendolyn Herman Anquinette Pierro, MD -   ASSESSMENT & PLAN:  1. De Quervain's tenosynovitis: Physical exam and ultrasound consistent with de Quervain's tenosynovitis. Since improved with splint, no indication for steroid injection at this time.  - Supportive care including spica splint, topical Voltaren gel, and rubber band exercises. Return precautions discussed   Gwendolyn CornShenell Reynolds, DO  [facility], PGY1  I observed and examined the patient with the resident and agree with assessment and plan.  Note reviewed and modified by me. Gwendolyn BaasKarl Trentan Trippe, MD

## 2018-04-13 ENCOUNTER — Other Ambulatory Visit: Payer: Self-pay | Admitting: Internal Medicine

## 2018-04-15 DIAGNOSIS — R69 Illness, unspecified: Secondary | ICD-10-CM | POA: Diagnosis not present

## 2018-05-11 ENCOUNTER — Encounter: Payer: Self-pay | Admitting: Internal Medicine

## 2018-05-11 ENCOUNTER — Encounter: Payer: Self-pay | Admitting: Sports Medicine

## 2018-05-11 ENCOUNTER — Ambulatory Visit: Payer: Medicare HMO | Admitting: Sports Medicine

## 2018-05-11 VITALS — BP 124/74 | Ht 62.0 in | Wt 164.0 lb

## 2018-05-11 DIAGNOSIS — M654 Radial styloid tenosynovitis [de Quervain]: Secondary | ICD-10-CM | POA: Insufficient documentation

## 2018-05-11 DIAGNOSIS — H2513 Age-related nuclear cataract, bilateral: Secondary | ICD-10-CM | POA: Diagnosis not present

## 2018-05-11 DIAGNOSIS — E119 Type 2 diabetes mellitus without complications: Secondary | ICD-10-CM | POA: Diagnosis not present

## 2018-05-11 LAB — HM DIABETES EYE EXAM

## 2018-05-11 NOTE — Assessment & Plan Note (Addendum)
Patient with left de Quervain's tenosynovitis.  Has improved somewhat with conservative measures including bracing, topical NSAIDs and home PT. continue conservative treatment and follow-up in 1 month.

## 2018-05-11 NOTE — Progress Notes (Signed)
    Subjective:  Gwendolyn Herman is a 68 y.o. female who presents to the [facility] for follow-up on left de Quervain's tenosynovitis.   HPI: Patient was seen in clinic 04/01/2018 and diagnosed with left de Quervain's tenosynovitis.  Patient reports that her pain is 80% improved.  She has been able to go 1 week without using her brace.  She is still using it at night intermittently.  She is using topical Voltaren gel and this does cause some improvement.  She has been doing the) exercises as prescribed.  Patient reports that she is wishing that the pain would be able to go away completely.  Pain is worse in the morning, does not radiate anywhere.  Seems to be aggravated by pulling things up to her or when she sometimes lifts her arm over her head.  Note she is using tumeric drink and thinks this helps  ROS Some periodic numbness to fingers from her chronic carpal tunnel No wrist swelling  Objective:  Physical Exam: BP 124/74   Ht 5\' 2"  (1.575 m)   Wt 164 lb (74.4 kg)   BMI 30.00 kg/m   Gen: NAD, resting comfortably HEENNT: No JVD Pulm: NWOB, no respiratory distress  Skin: warm, dry Psych: Normal affect and thought content  Left wrist Inspection: No obvious effusion or bruising Palpation: No bony focal tenderness along the base of the thumb or elsewhere ROM: Full range of motion extension flexion abduction abduction, thumb has full range of motion  abduction abduction Strength: 5 of 5 strength in finger spacing, 5 of 5 strength wrist flexion and extension Stability: Joint is stable Special tests: Positive Finkelstein, Vascular studies: Neurovascularly intact  Ultrasound of Left wrist: : Improved appearance with resolution ofswelling over compartment 1 Tendon intact compartments 2 through 6 appear normal  Impression: resolving DeQuervains tenosynovitis  Ultrasound and interpretation by Sibyl Parr. Jarah Pember, MD   No results found for this or any previous visit (from the past 72  hour(s)).   Assessment/Plan:  De Quervain's tenosynovitis, left Patient with left de Quervain's tenosynovitis.  Has improved somewhat with conservative measures including bracing, topical NSAIDs and home PT. continue conservative treatment and follow-up in 1 month.     Lab Orders  No laboratory test(s) ordered today    No orders of the defined types were placed in this encounter.     Thomes Dinning, MD, MS FAMILY MEDICINE RESIDENT - PGY2 05/11/2018 12:00 PM   I observed and examined the patient with the resident and agree with assessment and plan.  Note reviewed and modified by me. Sterling Big, MD

## 2018-05-13 ENCOUNTER — Ambulatory Visit: Payer: PPO | Admitting: Sports Medicine

## 2018-07-08 ENCOUNTER — Other Ambulatory Visit (INDEPENDENT_AMBULATORY_CARE_PROVIDER_SITE_OTHER): Payer: Medicare HMO | Admitting: Internal Medicine

## 2018-07-08 ENCOUNTER — Other Ambulatory Visit: Payer: Self-pay

## 2018-07-08 VITALS — Temp 98.0°F

## 2018-07-08 DIAGNOSIS — R8290 Unspecified abnormal findings in urine: Secondary | ICD-10-CM | POA: Diagnosis not present

## 2018-07-08 DIAGNOSIS — R7302 Impaired glucose tolerance (oral): Secondary | ICD-10-CM | POA: Diagnosis not present

## 2018-07-08 DIAGNOSIS — E785 Hyperlipidemia, unspecified: Secondary | ICD-10-CM | POA: Diagnosis not present

## 2018-07-08 DIAGNOSIS — E8881 Metabolic syndrome: Secondary | ICD-10-CM | POA: Diagnosis not present

## 2018-07-08 DIAGNOSIS — I10 Essential (primary) hypertension: Secondary | ICD-10-CM | POA: Diagnosis not present

## 2018-07-08 DIAGNOSIS — Z0000 Encounter for general adult medical examination without abnormal findings: Secondary | ICD-10-CM | POA: Diagnosis not present

## 2018-07-08 LAB — POCT URINALYSIS DIPSTICK
Bilirubin, UA: NEGATIVE
GLUCOSE UA: NEGATIVE
Ketones, UA: NEGATIVE
Nitrite, UA: NEGATIVE
PROTEIN UA: NEGATIVE
Spec Grav, UA: 1.015 (ref 1.010–1.025)
Urobilinogen, UA: 0.2 E.U./dL
pH, UA: 6 (ref 5.0–8.0)

## 2018-07-08 NOTE — Addendum Note (Signed)
Addended by: Gregery Na on: 07/08/2018 09:08 AM   Modules accepted: Orders

## 2018-07-08 NOTE — Addendum Note (Signed)
Addended by: Gregery Na on: 07/08/2018 11:33 AM   Modules accepted: Orders

## 2018-07-09 LAB — CBC WITH DIFFERENTIAL/PLATELET
Absolute Monocytes: 392 cells/uL (ref 200–950)
BASOS ABS: 49 {cells}/uL (ref 0–200)
Basophils Relative: 1 %
EOS ABS: 152 {cells}/uL (ref 15–500)
Eosinophils Relative: 3.1 %
HCT: 39.3 % (ref 35.0–45.0)
Hemoglobin: 13.4 g/dL (ref 11.7–15.5)
Lymphs Abs: 1578 cells/uL (ref 850–3900)
MCH: 31.5 pg (ref 27.0–33.0)
MCHC: 34.1 g/dL (ref 32.0–36.0)
MCV: 92.3 fL (ref 80.0–100.0)
MONOS PCT: 8 %
MPV: 10.1 fL (ref 7.5–12.5)
NEUTROS PCT: 55.7 %
Neutro Abs: 2729 cells/uL (ref 1500–7800)
PLATELETS: 293 10*3/uL (ref 140–400)
RBC: 4.26 10*6/uL (ref 3.80–5.10)
RDW: 12.4 % (ref 11.0–15.0)
TOTAL LYMPHOCYTE: 32.2 %
WBC: 4.9 10*3/uL (ref 3.8–10.8)

## 2018-07-09 LAB — HEMOGLOBIN A1C
HEMOGLOBIN A1C: 6 %{Hb} — AB (ref <5.7)
MEAN PLASMA GLUCOSE: 126 (calc)
eAG (mmol/L): 7 (calc)

## 2018-07-09 LAB — COMPLETE METABOLIC PANEL WITH GFR
AG Ratio: 1.8 (calc) (ref 1.0–2.5)
ALKALINE PHOSPHATASE (APISO): 56 U/L (ref 37–153)
ALT: 25 U/L (ref 6–29)
AST: 21 U/L (ref 10–35)
Albumin: 4.4 g/dL (ref 3.6–5.1)
BILIRUBIN TOTAL: 0.8 mg/dL (ref 0.2–1.2)
BUN: 16 mg/dL (ref 7–25)
CHLORIDE: 106 mmol/L (ref 98–110)
CO2: 25 mmol/L (ref 20–32)
Calcium: 9.3 mg/dL (ref 8.6–10.4)
Creat: 0.74 mg/dL (ref 0.50–0.99)
GFR, Est African American: 98 mL/min/{1.73_m2} (ref >=60)
GFR, Est Non African American: 84 mL/min/{1.73_m2} (ref >=60)
GLUCOSE: 119 mg/dL — AB (ref 65–99)
Globulin: 2.4 g/dL (calc) (ref 1.9–3.7)
Potassium: 4 mmol/L (ref 3.5–5.3)
Sodium: 139 mmol/L (ref 135–146)
Total Protein: 6.8 g/dL (ref 6.1–8.1)

## 2018-07-09 LAB — LIPID PANEL
CHOLESTEROL: 150 mg/dL (ref <200)
HDL: 62 mg/dL (ref >=50)
LDL CHOLESTEROL (CALC): 65 mg/dL
Non-HDL Cholesterol (Calc): 88 mg/dL (calc) (ref <130)
TRIGLYCERIDES: 143 mg/dL (ref <150)
Total CHOL/HDL Ratio: 2.4 (calc) (ref <5.0)

## 2018-07-09 LAB — URINE CULTURE
MICRO NUMBER:: 356550
SPECIMEN QUALITY:: ADEQUATE

## 2018-07-09 LAB — TSH: TSH: 1.53 mIU/L (ref 0.40–4.50)

## 2018-07-12 ENCOUNTER — Ambulatory Visit (INDEPENDENT_AMBULATORY_CARE_PROVIDER_SITE_OTHER): Payer: Medicare HMO | Admitting: Internal Medicine

## 2018-07-12 ENCOUNTER — Other Ambulatory Visit: Payer: Self-pay

## 2018-07-12 ENCOUNTER — Encounter: Payer: Self-pay | Admitting: Internal Medicine

## 2018-07-12 VITALS — BP 110/80 | HR 82 | Temp 98.3°F | Ht 62.0 in | Wt 156.0 lb

## 2018-07-12 DIAGNOSIS — I10 Essential (primary) hypertension: Secondary | ICD-10-CM

## 2018-07-12 DIAGNOSIS — E782 Mixed hyperlipidemia: Secondary | ICD-10-CM | POA: Diagnosis not present

## 2018-07-12 DIAGNOSIS — R7302 Impaired glucose tolerance (oral): Secondary | ICD-10-CM

## 2018-07-12 DIAGNOSIS — E8881 Metabolic syndrome: Secondary | ICD-10-CM | POA: Diagnosis not present

## 2018-07-12 DIAGNOSIS — Z0000 Encounter for general adult medical examination without abnormal findings: Secondary | ICD-10-CM | POA: Diagnosis not present

## 2018-07-12 MED ORDER — AMLODIPINE BESYLATE 5 MG PO TABS: 5.0000 mg | ORAL_TABLET | Freq: Every morning | ORAL | 3 refills | Status: DC

## 2018-07-12 NOTE — Progress Notes (Signed)
Subjective:    Patient ID: Gwendolyn Herman, female    DOB: Apr 09, 1951, 68 y.o.   MRN: [id number] 68 year old female for Medicare wellness exam, evaluation of medical issues and health maintenance exam. HPI She has lost weight. Was 171.5 pounds Feb 2019. Now 156 pounds with BMI 28.53.  Her dog is been ill with GI issues and pneumonia.  It has been very stressful and she says this is the reason she has lost weight recently.  She has a history of impaired glucose tolerance, hypertension and hyperlipidemia.  Past medical history: Fractured right foot 1972, history of bilateral carpal tunnel syndrome, history of occasional issues with anxiety.  History of systolic murmur and had 2D echo in Aug 16, 2003 that was normal.  Was started on losartan in May 2011 for hypertension.  Is on Lipitor for hyperlipidemia which was started August 2011.  Social history: She is married to a Warden/ranger.  Prior to COVID-19 outbreak they were traveling quite a bit with his work.  She does not smoke.  Social alcohol consumption mainly consisting of beer.  She has adult children.  She has grandchildren.  Family history: Father died in 08/15/2001 in his sleep at age 53.  Mother died in 5 with history of congestive heart failure.  One sister with history of hypertension and hyperlipidemia also has pancreatic cancer.     Review of Systems  Constitutional: Positive for fatigue.  Respiratory: Negative.   Cardiovascular: Negative.   Gastrointestinal: Negative.   Genitourinary: Negative.   Musculoskeletal: Negative.   Neurological: Negative.   Psychiatric/Behavioral:       Situational stress with dog  All other systems reviewed and are negative.      Objective:   Physical Exam Vitals signs reviewed.  Constitutional:      General: She is not in acute distress.    Appearance: Normal appearance.  HENT:     Head: Normocephalic and atraumatic.     Right Ear: Tympanic membrane normal.     Left Ear: Tympanic membrane  normal.     Nose: Nose normal.     Mouth/Throat:     Mouth: Mucous membranes are moist.     Pharynx: Oropharynx is clear.  Eyes:     General: No scleral icterus.    Extraocular Movements: Extraocular movements intact.     Conjunctiva/sclera: Conjunctivae normal.     Pupils: Pupils are equal, round, and reactive to light.  Neck:     Musculoskeletal: Neck supple. No neck rigidity.     Vascular: No carotid bruit.     Comments: No thyromegaly Cardiovascular:     Rate and Rhythm: Normal rate and regular rhythm.     Heart sounds: No murmur.  Pulmonary:     Effort: Pulmonary effort is normal. No respiratory distress.     Breath sounds: Normal breath sounds. No wheezing.     Comments: Breast without masses Abdominal:     General: Bowel sounds are normal.     Palpations: Abdomen is soft.  Genitourinary:    Comments: Pap deferred due to age.  Bimanual normal Musculoskeletal:     Right lower leg: No edema.     Left lower leg: No edema.  Lymphadenopathy:     Cervical: No cervical adenopathy.  Skin:    General: Skin is warm and dry.  Neurological:     General: No focal deficit present.     Mental Status: She is alert and oriented to person, place, and time.  Psychiatric:  Mood and Affect: Mood normal.        Behavior: Behavior normal.        Thought Content: Thought content normal.        Judgment: Judgment normal.    BP 110/80       Assessment & Plan:  Impaired glucose tolerance-hemoglobin A1c is 6%.  Fasting glucose is 119  History of hyperlipidemia-lipid panel is completely normal  Abnormal urine dipstick-has trace LE and culture will be sent  Hypertension-blood pressure stable at 110/80 on current regimen  Plan: Return in 6 months or as needed.  She is due for pneumococcal 23 but I have asked her to defer this until after the COVID-19 outbreak since her dog is ill and she is under stress.  It has potential to cause local reaction and sore arm.  Subjective:    Patient presents for Medicare Annual/Subsequent preventive examination.  Review Past Medical/Family/Social: See above   Risk Factors  Current exercise habits: Exercises quite a bit Dietary issues discussed: Low-fat low carbohydrate  Cardiac risk factors: Hyperlipidemia and impaired glucose tolerance  Depression Screen  (Note: if answer to either of the following is "Yes", a more complete depression screening is indicated)   Over the past two weeks, have you felt down, depressed or hopeless? No  Over the past two weeks, have you felt little interest or pleasure in doing things? No Have you lost interest or pleasure in daily life? No Do you often feel hopeless? No Do you cry easily over simple problems? No   Activities of Daily Living  In your present state of health, do you have any difficulty performing the following activities?:   Driving? No  Managing money? No  Feeding yourself? No  Getting from bed to chair? No  Climbing a flight of stairs? No  Preparing food and eating?: No  Bathing or showering? No  Getting dressed: No  Getting to the toilet? No  Using the toilet:No  Moving around from place to place: No  In the past year have you fallen or had a near fall?:  Yes - one Are you sexually active?  Yes Do you have more than one partner? No   Hearing Difficulties: No  Do you often ask people to speak up or repeat themselves?  Yes Do you experience ringing or noises in your ears? No  Do you have difficulty understanding soft or whispered voices?  Yes Do you feel that you have a problem with memory? No Do you often misplace items?  Yes   Home Safety:  Do you have a smoke alarm at your residence? Yes Do you have grab bars in the bathroom?  No Do you have throw rugs in your house?  No   Cognitive Testing  Alert? Yes Normal Appearance?Yes  Oriented to person? Yes Place? Yes  Time? Yes  Recall of three objects? Yes  Can perform simple calculations? Yes  Displays  appropriate judgment?Yes  Can read the correct time from a watch face?Yes   List the Names of Other Physician/Practitioners you currently use:  See referral list for the physicians patient is currently seeing.     Review of Systems: See above   Objective:     General appearance: No acute distress Head: Normocephalic, without obvious abnormality, atraumatic  Eyes: conj clear, EOMi PEERLA  Ears: normal TM's and external ear canals both ears  Nose: Nares normal. Septum midline. Mucosa normal. No drainage or sinus tenderness.  Throat: lips, mucosa, and tongue normal;  teeth and gums normal  Neck: no adenopathy, no carotid bruit, no JVD, supple, symmetrical, trachea midline and thyroid not enlarged, symmetric, no tenderness/mass/nodules  No CVA tenderness.  Lungs: clear to auscultation bilaterally  Breasts: normal appearance, no masses or tenderness  Heart: regular rate and rhythm, S1, S2 normal, no murmur, click, rub or gallop  Abdomen: soft, non-tender; bowel sounds normal; no masses, no organomegaly  Musculoskeletal: ROM normal in all joints, no crepitus, no deformity, Normal muscle strengthen. Back  is symmetric, no curvature. Skin: Skin color, texture, turgor normal. No rashes or lesions  Lymph nodes: Cervical, supraclavicular, and axillary nodes normal.  Neurologic: CN 2 -12 Normal, Normal symmetric reflexes. Normal coordination and gait  Psych: Alert & Oriented x 3, Mood appear stable.    Assessment:    Annual wellness medicare exam   Plan:    During the course of the visit the patient was educated and counseled about appropriate screening and preventive services including:   Annual mammogram  Annual flu vaccine  To get pneumococcal 23 in the near future     Patient Instructions (the written plan) was given to the patient.  Medicare Attestation  I have personally reviewed:  The patient's medical and social history  Their use of alcohol, tobacco or illicit drugs   Their current medications and supplements  The patient's functional ability including ADLs,fall risks, home safety risks, cognitive, and hearing and visual impairment  Diet and physical activities  Evidence for depression or mood disorders  The patient's weight, height, BMI, and visual acuity have been recorded in the chart. I have made referrals, counseling, and provided education to the patient based on review of the above and I have provided the patient with a written personalized care plan for preventive services.       Hypertension-blood pressure is stable on current regimen

## 2018-07-12 NOTE — Patient Instructions (Signed)
It was a pleasure to see you today.  Continue current medications.  Continue diet and exercise regimen.  Follow-up in 6 months.  Please call for an appointment in the next 2 or 3 months to get pneumococcal 23 vaccine after COVID-19 outbreak has improved

## 2018-07-29 ENCOUNTER — Other Ambulatory Visit: Payer: Self-pay | Admitting: Internal Medicine

## 2018-09-20 ENCOUNTER — Other Ambulatory Visit: Payer: Self-pay | Admitting: Internal Medicine

## 2018-10-12 ENCOUNTER — Other Ambulatory Visit: Payer: Self-pay

## 2018-10-12 ENCOUNTER — Ambulatory Visit: Payer: Medicare HMO | Admitting: Sports Medicine

## 2018-10-12 DIAGNOSIS — M654 Radial styloid tenosynovitis [de Quervain]: Secondary | ICD-10-CM | POA: Diagnosis not present

## 2018-10-12 MED ORDER — TRAMADOL HCL 50 MG PO TABS: 50.0000 mg | ORAL_TABLET | Freq: Three times a day (TID) | ORAL | 0 refills | Status: DC | PRN

## 2018-10-12 NOTE — Assessment & Plan Note (Signed)
This is significantly improved Exam is not remarkable [place] is improved  Add icing Continue conservative care Light exercise and motion Caution with lifting technique  RTC prn

## 2018-10-12 NOTE — Progress Notes (Signed)
CC: Left wrist pain  Patient first seen in Dec. 2019 with Dequervains MOI was lifting dog with left arm [place] + in Dec Some improvement with conservative care by jan.  Today she feels this is 75% better However still with some pain with radial deviation and lifting Sometimes seems swollen Not icing  Past Hx Bilateral carpal tunnel  ROS No weakness No night pain No numbness  PE Pleasant F in NAD BP 110/66   Ht 5\' 6"  (1.676 m)   Wt 156 lb (70.8 kg)   BMI 25.18 kg/m   Normal flex/ Ext of wrist Norm. Radial and ulnar deviation No pain on norm movement Good intrinsic strength of hand Good strength wrist and thumb extension  Ultrasound of wrist  Compartment 1 still shows mild increase in hypoechoic fluid Distal tendons thickened on long axis Compartments 2 through 6 are normal Median N is ULN bilaterally  Impression; Chronic but resolving Dequervains tenosynovitis  Ultrasound and interpretation by Wolfgang Phoenix. Oneida Alar, MD

## 2018-10-28 ENCOUNTER — Other Ambulatory Visit: Payer: Self-pay | Admitting: Internal Medicine

## 2018-11-18 ENCOUNTER — Ambulatory Visit: Payer: Medicare HMO | Admitting: Sports Medicine

## 2018-11-18 ENCOUNTER — Ambulatory Visit: Payer: Self-pay

## 2018-11-18 ENCOUNTER — Other Ambulatory Visit: Payer: Self-pay | Admitting: Internal Medicine

## 2018-11-18 ENCOUNTER — Other Ambulatory Visit: Payer: Self-pay

## 2018-11-18 VITALS — BP 120/70 | Wt 155.5 lb

## 2018-11-18 DIAGNOSIS — S83281A Other tear of lateral meniscus, current injury, right knee, initial encounter: Secondary | ICD-10-CM | POA: Insufficient documentation

## 2018-11-18 DIAGNOSIS — M25561 Pain in right knee: Secondary | ICD-10-CM

## 2018-11-18 NOTE — Assessment & Plan Note (Signed)
Symptoms have mostly improved with ice and compression sleeve. Instructed to continue to wear compression sleeve with activity and to ice afterwards.

## 2018-11-18 NOTE — Progress Notes (Signed)
  Gwendolyn Herman - 68 y.o. female MRN [id number]  Date of birth: 09/05/1950  SUBJECTIVE:   CC: right knee popping  Gwendolyn Herman is a 68 yo female presenting with complaint of right knee "not feeling right." She first noticed this about a month ago when walking. During that walk, she tripped and caught herself before she fell. She had discomfort in lateral knee and limped briefly but then pain improved. She stopped walking for 2 weeks, iced knee, and wore a brace. She felt knee pain when she straightened her knee at night, improved when knee was bent. The pain has now improved and she has returned to walking 3-4 miles daily. She initially was taking tramadol for pain but has not taken tramadol in several days. The past two days, she has noticed a popping sound on the outside of her knee. The sound is not there today. No pain currently. No locking.  ROS: No unexpected weight loss, fever, chills, swelling, instability, muscle pain, numbness/tingling, redness, otherwise see HPI   PMHx - Updated and reviewed.  left wrist De Quervain's tenosynovitis, HTN, hyperlipidemia PSHx - Updated and reviewed.  Contributory factors include:  Negative FHx - Updated and reviewed.  Contributory factors include:  Negative Social Hx - Updated and reviewed. Contributory factors include: Negative Medications - reviewed   DATA REVIEWED: Prior records  PHYSICAL EXAM:  VS: BP:120/70  HR: bpm  TEMP: ( )  RESP:   HT:    WT:155 lb 8 oz (70.5 kg)  BMI:  PHYSICAL EXAM: Gen: NAD, alert, cooperative with exam, well-appearing HEENT: clear conjunctiva,  CV:  no edema, capillary refill brisk, normal rate Resp: non-labored Skin: no rashes, normal turgor  Neuro: no gross deficits.  Psych:  alert and oriented  Right Knee: - Inspection: no gross deformity. No swelling/effusion, erythema or bruising. Skin intact - Palpation: no TTP - ROM: full active ROM with flexion and extension in knee and hip - Strength: 5/5 strength -  Neuro/vasc: NV intact - Special Tests: - LIGAMENTS: negative anterior and posterior drawer, negative Lachman's, no MCL or LCL laxity  -- MENISCUS: negative McMurray's, negative Thessaly  -- PF JOINT: nml patellar mobility bilaterally.  negative patellar grind, negative patellar apprehension  Hips: normal ROM, negative FABER and FADIR bilaterally  Standing: varus stress of R leg  MSK ultrasound of right knee: Images were obtained both in the transverse and longitudinal plane. Patellar and quadriceps tendons were well visualized with no abnormalities. No effusion. Medial meniscus was well visualized with no abnormalities. Lateral mensicus well visualized with partial tear, hypoechoic changes.  Impression: partial tear of lateral meniscus  Ultrasound and interpretation by Wolfgang Phoenix. Fields, MD and Marcina Millard, MD  ASSESSMENT & PLAN:   Tear of lateral meniscus of right knee Symptoms have mostly improved with ice and compression sleeve. Instructed to continue to wear compression sleeve with activity and to ice afterwards.   Re ck 1 month

## 2018-12-29 DIAGNOSIS — R69 Illness, unspecified: Secondary | ICD-10-CM | POA: Diagnosis not present

## 2019-01-11 ENCOUNTER — Other Ambulatory Visit: Payer: Self-pay

## 2019-01-11 ENCOUNTER — Other Ambulatory Visit: Payer: Medicare HMO | Admitting: Internal Medicine

## 2019-01-11 DIAGNOSIS — E785 Hyperlipidemia, unspecified: Secondary | ICD-10-CM

## 2019-01-11 DIAGNOSIS — R7302 Impaired glucose tolerance (oral): Secondary | ICD-10-CM | POA: Diagnosis not present

## 2019-01-11 DIAGNOSIS — I10 Essential (primary) hypertension: Secondary | ICD-10-CM

## 2019-01-12 LAB — HEPATIC FUNCTION PANEL
AG Ratio: 1.7 (calc) (ref 1.0–2.5)
ALT: 20 U/L (ref 6–29)
AST: 22 U/L (ref 10–35)
Albumin: 4.2 g/dL (ref 3.6–5.1)
Alkaline phosphatase (APISO): 56 U/L (ref 37–153)
Bilirubin, Direct: 0.1 mg/dL (ref 0.0–0.2)
Globulin: 2.5 g/dL (calc) (ref 1.9–3.7)
Indirect Bilirubin: 0.6 mg/dL (calc) (ref 0.2–1.2)
Total Bilirubin: 0.7 mg/dL (ref 0.2–1.2)
Total Protein: 6.7 g/dL (ref 6.1–8.1)

## 2019-01-12 LAB — LIPID PANEL
Cholesterol: 168 mg/dL (ref <200)
HDL: 57 mg/dL (ref >=50)
LDL Cholesterol (Calc): 91 mg/dL (calc)
Non-HDL Cholesterol (Calc): 111 mg/dL (calc) (ref <130)
Total CHOL/HDL Ratio: 2.9 (calc) (ref <5.0)
Triglycerides: 107 mg/dL (ref <150)

## 2019-01-12 LAB — HEMOGLOBIN A1C
Hgb A1c MFr Bld: 5.9 % of total Hgb — ABNORMAL HIGH (ref <5.7)
Mean Plasma Glucose: 123 (calc)
eAG (mmol/L): 6.8 (calc)

## 2019-01-13 ENCOUNTER — Ambulatory Visit (INDEPENDENT_AMBULATORY_CARE_PROVIDER_SITE_OTHER): Payer: Medicare HMO | Admitting: Internal Medicine

## 2019-01-13 ENCOUNTER — Encounter: Payer: Self-pay | Admitting: Internal Medicine

## 2019-01-13 ENCOUNTER — Other Ambulatory Visit: Payer: Self-pay

## 2019-01-13 VITALS — BP 110/70 | HR 80 | Temp 98.1°F | Ht 66.0 in | Wt 157.0 lb

## 2019-01-13 DIAGNOSIS — E782 Mixed hyperlipidemia: Secondary | ICD-10-CM | POA: Diagnosis not present

## 2019-01-13 DIAGNOSIS — R69 Illness, unspecified: Secondary | ICD-10-CM | POA: Diagnosis not present

## 2019-01-13 DIAGNOSIS — Z23 Encounter for immunization: Secondary | ICD-10-CM

## 2019-01-13 DIAGNOSIS — E1169 Type 2 diabetes mellitus with other specified complication: Secondary | ICD-10-CM

## 2019-01-13 DIAGNOSIS — F411 Generalized anxiety disorder: Secondary | ICD-10-CM

## 2019-01-13 DIAGNOSIS — I10 Essential (primary) hypertension: Secondary | ICD-10-CM

## 2019-01-13 DIAGNOSIS — Z6825 Body mass index (BMI) 25.0-25.9, adult: Secondary | ICD-10-CM

## 2019-01-13 NOTE — Progress Notes (Signed)
   Subjective:    Patient ID: Gwendolyn Herman, female    DOB: March 15, 1951, 68 y.o.   MRN: [id number]  HPI  68 year old Female for 6 month recheck.  History of hypertension and hyperlipidemia.  She exercises regularly.  Weight is stable at 157 pounds and previously was 156 pounds in March 2020.  Current meds include Metformin 500 mg twice daily, losartan HCTZ 100/25 daily, Lipitor 80 mg daily, amlodipine 5 mg daily and as needed Xanax for anxiety.    Review of Systems     Objective:   Physical Exam Blood pressure 110/70, pulse 80 regular BMI 25.24 skin warm and dry.  Nodes none.  No thyromegaly or carotid bruits.  Chest clear to auscultation.  Cardiac exam regular rate and rhythm normal S1 and S2.  Extremities without edema.  Affect is normal.      Assessment & Plan:  Health maintenance - flu vaccine given  Essential hypertension-stable on current regimen with no changes  Impaired glucose tolerance stable on Metformin with hemoglobin A1c 5.9%  Hyperlipidemia lipid panel and liver functions normal on high-dose statin  BMI 25-continue diet and exercise  Anxiety state generally surrounding life events and issues with her dogs and treated with as needed Xanax sparingly.  Plan: She will return in 6 months for health maintenance exam or as needed.

## 2019-01-25 ENCOUNTER — Other Ambulatory Visit: Payer: Self-pay | Admitting: Internal Medicine

## 2019-01-25 DIAGNOSIS — Z1231 Encounter for screening mammogram for malignant neoplasm of breast: Secondary | ICD-10-CM

## 2019-01-26 ENCOUNTER — Other Ambulatory Visit: Payer: Self-pay | Admitting: Internal Medicine

## 2019-02-05 NOTE — Patient Instructions (Signed)
It was a pleasure to see you today.  Keep up the good work with glucose control and lipid management.  Continue current medications.  Flu vaccine given.

## 2019-03-05 DIAGNOSIS — Z20828 Contact with and (suspected) exposure to other viral communicable diseases: Secondary | ICD-10-CM | POA: Diagnosis not present

## 2019-03-15 ENCOUNTER — Other Ambulatory Visit: Payer: Self-pay

## 2019-03-15 ENCOUNTER — Ambulatory Visit
Admission: RE | Admit: 2019-03-15 | Discharge: 2019-03-15 | Disposition: A | Discharge status: Home / Self Care | Payer: Medicare HMO | Source: Ambulatory Visit | Attending: Internal Medicine | Admitting: Internal Medicine

## 2019-03-15 DIAGNOSIS — Z1231 Encounter for screening mammogram for malignant neoplasm of breast: Secondary | ICD-10-CM

## 2019-04-20 DIAGNOSIS — R69 Illness, unspecified: Secondary | ICD-10-CM | POA: Diagnosis not present

## 2019-04-26 ENCOUNTER — Other Ambulatory Visit: Payer: Self-pay | Admitting: Internal Medicine

## 2019-05-11 ENCOUNTER — Ambulatory Visit: Payer: Medicare HMO

## 2019-05-20 ENCOUNTER — Ambulatory Visit: Payer: Medicare HMO | Attending: Internal Medicine

## 2019-05-20 DIAGNOSIS — Z23 Encounter for immunization: Secondary | ICD-10-CM | POA: Insufficient documentation

## 2019-05-20 NOTE — Progress Notes (Signed)
   Covid-19 Vaccination Clinic  Name:  Gwendolyn Herman    MRN: [id number] DOB: 07/20/1950  05/20/2019  [name] was observed post Covid-19 immunization for 15 minutes without incidence. She was provided with Vaccine Information Sheet and instruction to access the V-Safe system.   [name] was instructed to call 911 with any severe reactions post vaccine: Marland Kitchen Difficulty breathing  . Swelling of your face and throat  . A fast heartbeat  . A bad rash all over your body  . Dizziness and weakness    Immunizations Administered    Name Date Dose VIS Date Route   Pfizer COVID-19 Vaccine 05/20/2019  2:53 PM 0.3 mL 03/25/2019 Intramuscular   Manufacturer: [employer], [employer]   Lot: [id number]   NDC: [address]-9

## 2019-05-22 ENCOUNTER — Ambulatory Visit: Payer: Medicare HMO

## 2019-06-15 ENCOUNTER — Ambulatory Visit: Payer: Medicare HMO | Attending: Internal Medicine

## 2019-06-15 DIAGNOSIS — Z23 Encounter for immunization: Secondary | ICD-10-CM

## 2019-06-15 NOTE — Progress Notes (Signed)
   Covid-19 Vaccination Clinic  Name:  Gwendolyn Herman    MRN: [id number] DOB: 1950/05/26  06/15/2019  [name] was observed post Covid-19 immunization for 15 minutes without incident. She was provided with Vaccine Information Sheet and instruction to access the V-Safe system.   [name] was instructed to call 911 with any severe reactions post vaccine: Marland Kitchen Difficulty breathing  . Swelling of face and throat  . A fast heartbeat  . A bad rash all over body  . Dizziness and weakness   Immunizations Administered    Name Date Dose VIS Date Route   Pfizer COVID-19 Vaccine 06/15/2019  2:50 PM 0.3 mL 03/25/2019 Intramuscular   Manufacturer: [employer], [employer]   Lot: [id number]   NDC: [address]-9

## 2019-06-16 ENCOUNTER — Encounter: Payer: Self-pay | Admitting: Internal Medicine

## 2019-06-16 DIAGNOSIS — E119 Type 2 diabetes mellitus without complications: Secondary | ICD-10-CM | POA: Diagnosis not present

## 2019-06-16 LAB — HM DIABETES EYE EXAM

## 2019-07-12 ENCOUNTER — Other Ambulatory Visit: Payer: Self-pay

## 2019-07-12 ENCOUNTER — Other Ambulatory Visit: Payer: Medicare HMO | Admitting: Internal Medicine

## 2019-07-12 DIAGNOSIS — Z1329 Encounter for screening for other suspected endocrine disorder: Secondary | ICD-10-CM

## 2019-07-12 DIAGNOSIS — E1169 Type 2 diabetes mellitus with other specified complication: Secondary | ICD-10-CM

## 2019-07-12 DIAGNOSIS — E782 Mixed hyperlipidemia: Secondary | ICD-10-CM

## 2019-07-12 DIAGNOSIS — R7302 Impaired glucose tolerance (oral): Secondary | ICD-10-CM | POA: Diagnosis not present

## 2019-07-12 DIAGNOSIS — I10 Essential (primary) hypertension: Secondary | ICD-10-CM | POA: Diagnosis not present

## 2019-07-12 DIAGNOSIS — E8881 Metabolic syndrome: Secondary | ICD-10-CM | POA: Diagnosis not present

## 2019-07-12 DIAGNOSIS — Z0000 Encounter for general adult medical examination without abnormal findings: Secondary | ICD-10-CM | POA: Diagnosis not present

## 2019-07-12 DIAGNOSIS — R69 Illness, unspecified: Secondary | ICD-10-CM | POA: Diagnosis not present

## 2019-07-12 DIAGNOSIS — F411 Generalized anxiety disorder: Secondary | ICD-10-CM

## 2019-07-13 LAB — COMPLETE METABOLIC PANEL WITH GFR
AG Ratio: 1.7 (calc) (ref 1.0–2.5)
ALT: 25 U/L (ref 6–29)
AST: 21 U/L (ref 10–35)
Albumin: 4.2 g/dL (ref 3.6–5.1)
Alkaline phosphatase (APISO): 56 U/L (ref 37–153)
BUN: 19 mg/dL (ref 7–25)
CO2: 27 mmol/L (ref 20–32)
Calcium: 9.6 mg/dL (ref 8.6–10.4)
Chloride: 105 mmol/L (ref 98–110)
Creat: 0.71 mg/dL (ref 0.50–0.99)
GFR, Est African American: 102 mL/min/{1.73_m2} (ref >=60)
GFR, Est Non African American: 88 mL/min/{1.73_m2} (ref >=60)
Globulin: 2.5 g/dL (calc) (ref 1.9–3.7)
Glucose, Bld: 111 mg/dL — ABNORMAL HIGH (ref 65–99)
Potassium: 4.3 mmol/L (ref 3.5–5.3)
Sodium: 140 mmol/L (ref 135–146)
Total Bilirubin: 0.6 mg/dL (ref 0.2–1.2)
Total Protein: 6.7 g/dL (ref 6.1–8.1)

## 2019-07-13 LAB — CBC WITH DIFFERENTIAL/PLATELET
Absolute Monocytes: 387 cells/uL (ref 200–950)
Basophils Absolute: 30 cells/uL (ref 0–200)
Basophils Relative: 0.7 %
Eosinophils Absolute: 181 cells/uL (ref 15–500)
Eosinophils Relative: 4.2 %
HCT: 39.4 % (ref 35.0–45.0)
Hemoglobin: 13.4 g/dL (ref 11.7–15.5)
Lymphs Abs: 1853 cells/uL (ref 850–3900)
MCH: 32.1 pg (ref 27.0–33.0)
MCHC: 34 g/dL (ref 32.0–36.0)
MCV: 94.5 fL (ref 80.0–100.0)
MPV: 10.1 fL (ref 7.5–12.5)
Monocytes Relative: 9 %
Neutro Abs: 1849 cells/uL (ref 1500–7800)
Neutrophils Relative %: 43 %
Platelets: 285 10*3/uL (ref 140–400)
RBC: 4.17 10*6/uL (ref 3.80–5.10)
RDW: 12.3 % (ref 11.0–15.0)
Total Lymphocyte: 43.1 %
WBC: 4.3 10*3/uL (ref 3.8–10.8)

## 2019-07-13 LAB — LIPID PANEL
Cholesterol: 167 mg/dL (ref <200)
HDL: 58 mg/dL (ref >=50)
LDL Cholesterol (Calc): 93 mg/dL (calc)
Non-HDL Cholesterol (Calc): 109 mg/dL (calc) (ref <130)
Total CHOL/HDL Ratio: 2.9 (calc) (ref <5.0)
Triglycerides: 72 mg/dL (ref <150)

## 2019-07-13 LAB — HEMOGLOBIN A1C
Hgb A1c MFr Bld: 5.7 % of total Hgb — ABNORMAL HIGH (ref <5.7)
Mean Plasma Glucose: 117 (calc)
eAG (mmol/L): 6.5 (calc)

## 2019-07-13 LAB — TSH: TSH: 2.16 mIU/L (ref 0.40–4.50)

## 2019-07-14 ENCOUNTER — Other Ambulatory Visit: Payer: Self-pay

## 2019-07-14 ENCOUNTER — Encounter: Payer: Self-pay | Admitting: Internal Medicine

## 2019-07-14 ENCOUNTER — Ambulatory Visit (INDEPENDENT_AMBULATORY_CARE_PROVIDER_SITE_OTHER): Payer: Medicare HMO | Admitting: Internal Medicine

## 2019-07-14 VITALS — BP 110/80 | HR 74 | Temp 98.4°F | Ht 62.0 in | Wt 155.0 lb

## 2019-07-14 DIAGNOSIS — Z0000 Encounter for general adult medical examination without abnormal findings: Secondary | ICD-10-CM

## 2019-07-14 DIAGNOSIS — E8881 Metabolic syndrome: Secondary | ICD-10-CM

## 2019-07-14 DIAGNOSIS — I10 Essential (primary) hypertension: Secondary | ICD-10-CM

## 2019-07-14 DIAGNOSIS — E782 Mixed hyperlipidemia: Secondary | ICD-10-CM

## 2019-07-14 LAB — POCT URINALYSIS DIPSTICK
Appearance: NEGATIVE
Bilirubin, UA: NEGATIVE
Blood, UA: NEGATIVE
Glucose, UA: NEGATIVE
Ketones, UA: NEGATIVE
Leukocytes, UA: NEGATIVE
Nitrite, UA: NEGATIVE
Odor: NEGATIVE
Protein, UA: NEGATIVE
Spec Grav, UA: 1.015 (ref 1.010–1.025)
Urobilinogen, UA: 0.2 E.U./dL
pH, UA: 6.5 (ref 5.0–8.0)

## 2019-07-14 NOTE — Progress Notes (Signed)
Subjective:    Patient ID: Gwendolyn Herman, female    DOB: Jan 13, 1951, 69 y.o.   MRN: [id number]  HPI  69 year old Female for health maintenance exam, Medicare wellness and evaluation of medical issues.  Labs are essentially normal. Hgb AIC stable at 5.7. TSH is normal. Lipid panel is normal.Glucose is 111  Continues with busy life.  [place] ready for rental season.  Did well during the pandemic and has had both COVID-19 vaccines without issue.  History of de Quervain's tenosynovitis left arm from lifting a dog seen by Dr. Oneida Alar in June 2020.  Is having similar issues with bone hands after lifting flowerpots.  Have recommended anti-inflammatory medication prior to doing this physical activity.  History of impaired glucose tolerance.  A1c is stable at 5.7%.  History of hypertension and hyperlipidemia.  Her weight in March 2020 was 156 pounds and is now 155 pounds.  Past medical history: History of fractured right foot 1970/08/26, history of bilateral carpal tunnel syndrome, history of occasional issues with anxiety.  History of systolic murmur and had 2D echocardiogram 08-26-2003 that was normal.  Was started on losartan May 2011 for hypertension.  Lipitor was started in August 2011 for hyperlipidemia.  Social history: She is married to a Engineer, water.  She does not smoke.  Social alcohol consumption mainly consisting of beer.  She has adult children and grandchildren.  Family history: Father died in 08-25-01 in his sleep at age 65.  Mother died in 31 with history of congestive heart failure.  1 sister with history of hypertension hyperlipidemia and pancreatic cancer.    Review of Systems  Constitutional: Negative.   Respiratory: Negative.   Cardiovascular: Negative.   Gastrointestinal: Negative.   Genitourinary: Negative.   Musculoskeletal: Negative.   Neurological: Negative.        Objective:   Physical Exam Blood pressure 110/80, pulse 74 pulse  oximetry 96% weight 155 pounds.  BMI 28.35  Skin warm and dry.  Nodes none.  TMs are clear.  Neck is supple without JVD thyromegaly or carotid bruits.  Chest clear to auscultation.  Breast without masses.  Cardiac exam regular rate and rhythm normal S1 and S2.  Abdomen soft nondistended without hepatosplenomegaly masses or tenderness.  Pap deferred due to age.  Bimanual normal.  No lower extremity pitting edema.  Neuro no gross focal deficits.  Affect thought and judgment are normal.       Assessment & Plan:  Essential hypertension-stable on current regimen  Hyperlipidemia-lipid panel is normal on statin therapy.  Liver functions are normal.  Impaired glucose tolerance hemoglobin A1c excellent at 5.7%.  This is the best it has been.  Obesity-continue diet and exercise efforts.  Plan: I am pleased with her lab work and her weight during the pandemic.  She will continue with current medications and follow-up in 6 months.  Subjective:   Patient presents for Medicare Annual/Subsequent preventive examination.  Review Past Medical/Family/Social: See above   Risk Factors  Current exercise habits: Exercises regularly and rides bicycle Dietary issues discussed: Low-fat low carbohydrate  Cardiac risk factors: Family history, history of hyperlipidemia, impaired glucose tolerance  Depression Screen  (Note: if answer to either of the following is "Yes", a more complete depression screening is indicated)   Over the past two weeks, have you felt down, depressed or hopeless? No  Over the past two weeks, have you felt little interest or pleasure in doing  things? No Have you lost interest or pleasure in daily life? No Do you often feel hopeless? No Do you cry easily over simple problems? No   Activities of Daily Living  In your present state of health, do you have any difficulty performing the following activities?:   Driving? No  Managing money? No  Feeding yourself? No  Getting from bed  to chair? No  Climbing a flight of stairs? No  Preparing food and eating?: No  Bathing or showering? No  Getting dressed: No  Getting to the toilet? No  Using the toilet:No  Moving around from place to place: No  In the past year have you fallen or had a near fall?:No  Are you sexually active?  Yes Do you have more than one partner? No   Hearing Difficulties: No  Do you often ask people to speak up or repeat themselves?  Yes Do you experience ringing or noises in your ears? No  Do you have difficulty understanding soft or whispered voices? No  Do you feel that you have a problem with memory?  On occasion Do you often misplace items?  Yes   Home Safety:  Do you have a smoke alarm at your residence? Yes Do you have grab bars in the bathroom?  None Do you have throw rugs in your house?  None   Cognitive Testing  Alert? Yes Normal Appearance?Yes  Oriented to person? Yes Place? Yes  Time? Yes  Recall of three objects? Yes  Can perform simple calculations? Yes  Displays appropriate judgment?Yes  Can read the correct time from a watch face?Yes   List the Names of Other Physician/Practitioners you currently use:  See referral list for the physicians patient is currently seeing.  Dr. Roanna Epley   Review of Systems: See above   Objective:     General appearance: Well nourished Head: Normocephalic, without obvious abnormality, atraumatic  Eyes: conj clear, EOMi PEERLA  Ears: normal TM's and external ear canals both ears  Nose: Nares normal. Septum midline. Mucosa normal. No drainage or sinus tenderness.  Throat: lips, mucosa, and tongue normal; teeth and gums normal  Neck: no adenopathy, no carotid bruit, no JVD, supple, symmetrical, trachea midline and thyroid not enlarged, symmetric, no tenderness/mass/nodules  No CVA tenderness.  Lungs: clear to auscultation bilaterally  Breasts: normal appearance, no masses or tenderness Heart: regular rate and rhythm, S1, S2 normal,  no murmur, click, rub or gallop  Abdomen: soft, non-tender; bowel sounds normal; no masses, no organomegaly  Musculoskeletal: ROM normal in all joints, no crepitus, no deformity, Normal muscle strengthen. Back  is symmetric, no curvature. Skin: Skin color, texture, turgor normal. No rashes or lesions  Lymph nodes: Cervical, supraclavicular, and axillary nodes normal.  Neurologic: CN 2 -12 Normal, Normal symmetric reflexes. Normal coordination and gait  Psych: Alert & Oriented x 3, Mood appear stable.    Assessment:    Annual wellness medicare exam   Plan:    During the course of the visit the patient was educated and counseled about appropriate screening and preventive services including:   Annual mammogram  Had COVID-19 vaccines on February 5 and March 3   Patient Instructions (the written plan) was given to the patient.  Medicare Attestation  I have personally reviewed:  The patient's medical and social history  Their use of alcohol, tobacco or illicit drugs  Their current medications and supplements  The patient's functional ability including ADLs,fall risks, home safety risks, cognitive, and hearing and  visual impairment  Diet and physical activities  Evidence for depression or mood disorders  The patient's weight, height, BMI, and visual acuity have been recorded in the chart. I have made referrals, counseling, and provided education to the patient based on review of the above and I have provided the patient with a written personalized care plan for preventive services.

## 2019-07-25 ENCOUNTER — Other Ambulatory Visit: Payer: Self-pay | Admitting: Internal Medicine

## 2019-07-31 NOTE — Patient Instructions (Addendum)
It was a pleasure to see you today.  Your lab work is stable and within normal limits.  Your blood pressure is excellent on current regimen.  Return in 6 months.

## 2019-08-16 DIAGNOSIS — R69 Illness, unspecified: Secondary | ICD-10-CM | POA: Diagnosis not present

## 2019-10-23 ENCOUNTER — Other Ambulatory Visit: Payer: Self-pay | Admitting: Internal Medicine

## 2019-12-28 DIAGNOSIS — R69 Illness, unspecified: Secondary | ICD-10-CM | POA: Diagnosis not present

## 2020-01-17 ENCOUNTER — Other Ambulatory Visit: Payer: Medicare HMO | Admitting: Internal Medicine

## 2020-01-17 ENCOUNTER — Other Ambulatory Visit: Payer: Self-pay

## 2020-01-17 DIAGNOSIS — G5600 Carpal tunnel syndrome, unspecified upper limb: Secondary | ICD-10-CM

## 2020-01-17 DIAGNOSIS — I10 Essential (primary) hypertension: Secondary | ICD-10-CM

## 2020-01-17 DIAGNOSIS — R69 Illness, unspecified: Secondary | ICD-10-CM | POA: Diagnosis not present

## 2020-01-17 DIAGNOSIS — E782 Mixed hyperlipidemia: Secondary | ICD-10-CM

## 2020-01-17 DIAGNOSIS — Z0000 Encounter for general adult medical examination without abnormal findings: Secondary | ICD-10-CM

## 2020-01-17 DIAGNOSIS — E8881 Metabolic syndrome: Secondary | ICD-10-CM

## 2020-01-17 DIAGNOSIS — F411 Generalized anxiety disorder: Secondary | ICD-10-CM

## 2020-01-17 DIAGNOSIS — E1169 Type 2 diabetes mellitus with other specified complication: Secondary | ICD-10-CM

## 2020-01-18 LAB — HEMOGLOBIN A1C
Hgb A1c MFr Bld: 6 % of total Hgb — ABNORMAL HIGH (ref <5.7)
Mean Plasma Glucose: 126 (calc)
eAG (mmol/L): 7 (calc)

## 2020-01-18 LAB — LIPID PANEL
Cholesterol: 194 mg/dL (ref <200)
HDL: 59 mg/dL (ref >=50)
LDL Cholesterol (Calc): 105 mg/dL (calc) — ABNORMAL HIGH
Non-HDL Cholesterol (Calc): 135 mg/dL (calc) — ABNORMAL HIGH (ref <130)
Total CHOL/HDL Ratio: 3.3 (calc) (ref <5.0)
Triglycerides: 186 mg/dL — ABNORMAL HIGH (ref <150)

## 2020-01-18 LAB — TSH: TSH: 2.23 mIU/L (ref 0.40–4.50)

## 2020-01-19 ENCOUNTER — Ambulatory Visit (INDEPENDENT_AMBULATORY_CARE_PROVIDER_SITE_OTHER): Payer: Medicare HMO | Admitting: Internal Medicine

## 2020-01-19 ENCOUNTER — Encounter: Payer: Self-pay | Admitting: Internal Medicine

## 2020-01-19 ENCOUNTER — Other Ambulatory Visit: Payer: Self-pay

## 2020-01-19 VITALS — BP 100/70 | HR 71 | Ht 62.0 in | Wt 162.0 lb

## 2020-01-19 DIAGNOSIS — Z6829 Body mass index (BMI) 29.0-29.9, adult: Secondary | ICD-10-CM | POA: Diagnosis not present

## 2020-01-19 DIAGNOSIS — E1169 Type 2 diabetes mellitus with other specified complication: Secondary | ICD-10-CM | POA: Diagnosis not present

## 2020-01-19 DIAGNOSIS — E782 Mixed hyperlipidemia: Secondary | ICD-10-CM

## 2020-01-19 DIAGNOSIS — I10 Essential (primary) hypertension: Secondary | ICD-10-CM

## 2020-01-19 DIAGNOSIS — E8881 Metabolic syndrome: Secondary | ICD-10-CM

## 2020-01-19 NOTE — Progress Notes (Signed)
   Subjective:    Patient ID: Gwendolyn Herman, female    DOB: 04-07-1951, 69 y.o.   MRN: [id number]  HPI  69 year old Female seen for 6 month recheck. Her triglycerides have gone up from 72 in March to 186.  LDL cholesterol was 93 in March and is now slightly higher at 105.  Total cholesterol is normal.  HDL cholesterol is 59 and was 58 in March.  Her blood pressure is under good control with losartan HCTZ 100/25 daily and Norvasc 5 mg daily.  Remains on Lipitor 80 mg daily.  Hemoglobin A1c is stable at 6% and was 5.7% in March.  Is on Metformin 500 mg twice daily for impaired glucose tolerance.    Review of Systems In March 2020, was riding a bike and developed right hip pain. Has been riding again recently. Has had some concern about recurrence of hip pain.     Objective:   Physical Exam Blood pressure 100/70 pulse 71 pulse oximetry 97% weight 162 pounds BMI 29.63 Neck is supple without JVD thyromegaly or carotid bruits.  Chest is clear to auscultation.  Cardiac exam: Regular rate and rhythm, normal S1 and S2 without murmurs or gallops.  No lower extremity pitting edema.      Assessment & Plan:  Essential hypertension-stable on current regimen  Hemoglobin A1c is 6% with history of impaired glucose tolerance treated with Metformin twice daily.  This is stable.  Hyperlipidemia-triglycerides for some reason are more elevated than they have been in several years at 186.  Most likely this is recent dietary indiscretion.  She gets a fair amount of exercise walking her dogs.  We will recheck this in 6 months and will not change medication at this point in time.  She is on maximum amount of Lipitor.

## 2020-02-11 NOTE — Patient Instructions (Signed)
It was a pleasure to see you today.  Triglycerides are more elevated than they have been recently.  Continue with Metformin twice daily and continue with generic Lipitor 80 mg daily.  Blood pressures under excellent control.  Watch diet.  Return in 6 months for health maintenance exam.

## 2020-03-01 ENCOUNTER — Other Ambulatory Visit: Payer: Self-pay | Admitting: Internal Medicine

## 2020-03-14 ENCOUNTER — Other Ambulatory Visit: Payer: Self-pay | Admitting: Internal Medicine

## 2020-03-14 DIAGNOSIS — Z1231 Encounter for screening mammogram for malignant neoplasm of breast: Secondary | ICD-10-CM

## 2020-03-20 ENCOUNTER — Other Ambulatory Visit: Payer: Self-pay

## 2020-03-20 ENCOUNTER — Ambulatory Visit
Admission: RE | Admit: 2020-03-20 | Discharge: 2020-03-20 | Disposition: A | Discharge status: Home / Self Care | Payer: Medicare HMO | Source: Ambulatory Visit | Attending: Internal Medicine | Admitting: Internal Medicine

## 2020-03-20 DIAGNOSIS — Z1231 Encounter for screening mammogram for malignant neoplasm of breast: Secondary | ICD-10-CM | POA: Diagnosis not present

## 2020-06-20 ENCOUNTER — Encounter: Payer: Self-pay | Admitting: Internal Medicine

## 2020-06-20 DIAGNOSIS — H524 Presbyopia: Secondary | ICD-10-CM | POA: Diagnosis not present

## 2020-06-20 DIAGNOSIS — E119 Type 2 diabetes mellitus without complications: Secondary | ICD-10-CM | POA: Diagnosis not present

## 2020-06-20 DIAGNOSIS — H2513 Age-related nuclear cataract, bilateral: Secondary | ICD-10-CM | POA: Diagnosis not present

## 2020-06-20 LAB — HM DIABETES EYE EXAM

## 2020-07-17 ENCOUNTER — Other Ambulatory Visit: Payer: Self-pay

## 2020-07-17 ENCOUNTER — Other Ambulatory Visit: Payer: Medicare HMO | Admitting: Internal Medicine

## 2020-07-17 DIAGNOSIS — Z0000 Encounter for general adult medical examination without abnormal findings: Secondary | ICD-10-CM | POA: Diagnosis not present

## 2020-07-17 DIAGNOSIS — E1169 Type 2 diabetes mellitus with other specified complication: Secondary | ICD-10-CM

## 2020-07-17 DIAGNOSIS — E119 Type 2 diabetes mellitus without complications: Secondary | ICD-10-CM | POA: Diagnosis not present

## 2020-07-17 DIAGNOSIS — E8881 Metabolic syndrome: Secondary | ICD-10-CM | POA: Diagnosis not present

## 2020-07-17 DIAGNOSIS — Z6829 Body mass index (BMI) 29.0-29.9, adult: Secondary | ICD-10-CM | POA: Diagnosis not present

## 2020-07-17 DIAGNOSIS — R69 Illness, unspecified: Secondary | ICD-10-CM | POA: Diagnosis not present

## 2020-07-17 DIAGNOSIS — F411 Generalized anxiety disorder: Secondary | ICD-10-CM

## 2020-07-17 DIAGNOSIS — E782 Mixed hyperlipidemia: Secondary | ICD-10-CM

## 2020-07-17 DIAGNOSIS — I10 Essential (primary) hypertension: Secondary | ICD-10-CM

## 2020-07-18 LAB — CBC WITH DIFFERENTIAL/PLATELET
Absolute Monocytes: 437 cells/uL (ref 200–950)
Basophils Absolute: 38 cells/uL (ref 0–200)
Basophils Relative: 0.9 %
Eosinophils Absolute: 130 cells/uL (ref 15–500)
Eosinophils Relative: 3.1 %
HCT: 40.9 % (ref 35.0–45.0)
Hemoglobin: 13.8 g/dL (ref 11.7–15.5)
Lymphs Abs: 1873 cells/uL (ref 850–3900)
MCH: 32 pg (ref 27.0–33.0)
MCHC: 33.7 g/dL (ref 32.0–36.0)
MCV: 94.9 fL (ref 80.0–100.0)
MPV: 9.8 fL (ref 7.5–12.5)
Monocytes Relative: 10.4 %
Neutro Abs: 1722 cells/uL (ref 1500–7800)
Neutrophils Relative %: 41 %
Platelets: 294 10*3/uL (ref 140–400)
RBC: 4.31 10*6/uL (ref 3.80–5.10)
RDW: 12.5 % (ref 11.0–15.0)
Total Lymphocyte: 44.6 %
WBC: 4.2 10*3/uL (ref 3.8–10.8)

## 2020-07-18 LAB — LIPID PANEL
Cholesterol: 181 mg/dL (ref <200)
HDL: 61 mg/dL (ref >=50)
LDL Cholesterol (Calc): 103 mg/dL (calc) — ABNORMAL HIGH
Non-HDL Cholesterol (Calc): 120 mg/dL (calc) (ref <130)
Total CHOL/HDL Ratio: 3 (calc) (ref <5.0)
Triglycerides: 83 mg/dL (ref <150)

## 2020-07-18 LAB — COMPLETE METABOLIC PANEL WITH GFR
AG Ratio: 1.7 (calc) (ref 1.0–2.5)
ALT: 30 U/L — ABNORMAL HIGH (ref 6–29)
AST: 27 U/L (ref 10–35)
Albumin: 4.5 g/dL (ref 3.6–5.1)
Alkaline phosphatase (APISO): 67 U/L (ref 37–153)
BUN: 24 mg/dL (ref 7–25)
CO2: 29 mmol/L (ref 20–32)
Calcium: 9.7 mg/dL (ref 8.6–10.4)
Chloride: 102 mmol/L (ref 98–110)
Creat: 0.73 mg/dL (ref 0.50–0.99)
GFR, Est African American: 98 mL/min/{1.73_m2} (ref >=60)
GFR, Est Non African American: 85 mL/min/{1.73_m2} (ref >=60)
Globulin: 2.6 g/dL (calc) (ref 1.9–3.7)
Glucose, Bld: 117 mg/dL — ABNORMAL HIGH (ref 65–99)
Potassium: 4.4 mmol/L (ref 3.5–5.3)
Sodium: 139 mmol/L (ref 135–146)
Total Bilirubin: 0.9 mg/dL (ref 0.2–1.2)
Total Protein: 7.1 g/dL (ref 6.1–8.1)

## 2020-07-18 LAB — HEMOGLOBIN A1C
Hgb A1c MFr Bld: 6 % of total Hgb — ABNORMAL HIGH (ref <5.7)
Mean Plasma Glucose: 126 mg/dL
eAG (mmol/L): 7 mmol/L

## 2020-07-18 LAB — TSH: TSH: 2.33 mIU/L (ref 0.40–4.50)

## 2020-07-19 ENCOUNTER — Ambulatory Visit (INDEPENDENT_AMBULATORY_CARE_PROVIDER_SITE_OTHER): Payer: Medicare HMO | Admitting: Internal Medicine

## 2020-07-19 ENCOUNTER — Other Ambulatory Visit: Payer: Self-pay

## 2020-07-19 VITALS — BP 110/70 | HR 73 | Ht 62.0 in | Wt 161.0 lb

## 2020-07-19 DIAGNOSIS — Z6829 Body mass index (BMI) 29.0-29.9, adult: Secondary | ICD-10-CM

## 2020-07-19 DIAGNOSIS — E782 Mixed hyperlipidemia: Secondary | ICD-10-CM | POA: Diagnosis not present

## 2020-07-19 DIAGNOSIS — E1169 Type 2 diabetes mellitus with other specified complication: Secondary | ICD-10-CM

## 2020-07-19 DIAGNOSIS — Z0000 Encounter for general adult medical examination without abnormal findings: Secondary | ICD-10-CM

## 2020-07-19 DIAGNOSIS — I10 Essential (primary) hypertension: Secondary | ICD-10-CM

## 2020-07-19 DIAGNOSIS — E8881 Metabolic syndrome: Secondary | ICD-10-CM | POA: Diagnosis not present

## 2020-07-19 DIAGNOSIS — R8290 Unspecified abnormal findings in urine: Secondary | ICD-10-CM | POA: Diagnosis not present

## 2020-07-19 DIAGNOSIS — Z8249 Family history of ischemic heart disease and other diseases of the circulatory system: Secondary | ICD-10-CM

## 2020-07-19 LAB — POCT URINALYSIS DIPSTICK
Bilirubin, UA: NEGATIVE
Glucose, UA: NEGATIVE
Ketones, UA: NEGATIVE
Nitrite, UA: NEGATIVE
Protein, UA: NEGATIVE
Spec Grav, UA: 1.015 (ref 1.010–1.025)
Urobilinogen, UA: 0.2 E.U./dL
pH, UA: 6 (ref 5.0–8.0)

## 2020-07-19 MED ORDER — METFORMIN HCL 500 MG PO TABS: 1.0000 | ORAL_TABLET | Freq: Two times a day (BID) | ORAL | 11 refills | Status: DC

## 2020-07-19 NOTE — Progress Notes (Signed)
Subjective:    Patient ID: Gwendolyn Herman, female    DOB: 03-Feb-1951, 70 y.o.   MRN: [id number]  HPI  70 year old Female for [facility] [employer], health maintenance exam, and evaluation of medical issues.  History of hypertension and impaired glucose tolerance.  History of hyperlipidemia.  History of de Quervain's tenosynovitis left arm from lifting a dog.  Was seen by Dr. Roanna Epley in June 2020.  Past medical history: Fractured right foot 1972, history of bilateral carpal tunnel syndrome, history of occasional issues with anxiety, history of systolic murmur and had 2D echocardiogram in 12-Aug-2003 that was normal.  Was started on losartan May 2011 for hypertension.  Lipitor was started August 2011 for hyperlipidemia.  Social history: She is married to a Warden/ranger.  Does not smoke.  Social alcohol consumption mainly consisting of beer.  She has adult children and grandchildren.  Family history: Father died in 11-Aug-2001 in his sleep at age 75 and had history of hypertension.  Mother died in 6 with  with possible CVA and with history of congestive heart failure and hypertension.  1 sister with history of hypertension, coronary stent placement, and hyperlipidemia.    Review of Systems had recent ophthalmology evaluation in March.  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  Denies chest pain, shortness of breath, abdominal pain, depression, genitourinary symptoms.     Objective:   Physical Exam Blood pressure is excellent at 110/70, pulse 73 and regular, pulse oximetry 97%.  Weight is 161 pounds.  BMI 29.45.  Height 5 feet 2 inches.  Skin: Warm and dry.  No cervical adenopathy.  TMs are clear.  Pharynx is clear.  Neck is supple.  No carotid bruits.  No JVD.  No thyromegaly.  Chest is clear to auscultation without rales or wheezing.  Cardiac exam: Regular rate and rhythm.  Normal S1 and S2.  Abdomen: Soft, nondistended without  hepatosplenomegaly, masses, or tenderness.  Pap deferred due to age.  Bimanual exam is normal without masses.  No lower extremity pitting edema.  Neurological exam is normal without gross focal deficits.  Her affect, thought, and judgment appear normal to me.       Assessment & Plan:  Essential hypertension-stable on amlodipine and losartan HCTZ.  Creatinine is normal.  Impaired glucose tolerance-cannot tolerate metformin twice daily due to GI upset.  Hemoglobin A1c 6.0% which is excellent.  She will take one 500 mg metformin tablet in the morning.  She will watch her diet and follow-up in mid May.  Hyperlipidemia-triglycerides have improved from 186 in October to 68.  Very mild elevation of LDL at 103 and previously was 105 in October.  Continue statin medication  BMI 29.45 continue to work with diet and exercise.  In 30-Apr-2021she weighed 155 pounds.  Now weighs 161 pounds.  She has follow-up appointment in May regarding diabetes mellitus as she will only be taking one metformin tablet daily.  Has had for COVID-19 immunizations the last 1 being 08-11-2020.  Had mammogram in December 2021.  Had colonoscopy in 08/11/2012 with 10-year follow-up recommended.  Eye exam at [facility] in March 2022 was normal with no evidence of diabetic retinopathy.  Abnormal urine dipstick with 3+ LE-must be contaminant as urine culture had no growth.  Subjective:   Patient presents for Medicare Annual/Subsequent preventive examination.  Review Past Medical/Family/Social: See above   Risk Factors  Current exercise  habits: Exercises regularly Dietary issues discussed: Low-fat low carbohydrate  Cardiac risk factors: Hyperlipidemia, family history  Depression Screen  (Note: if answer to either of the following is "Yes", a more complete depression screening is indicated)   Over the past two weeks, have you felt down, depressed or hopeless? No  Over the past two weeks, have you felt little  interest or pleasure in doing things? No Have you lost interest or pleasure in daily life? No Do you often feel hopeless? No Do you cry easily over simple problems? No   Activities of Daily Living  In your present state of health, do you have any difficulty performing the following activities?:   Driving? No  Managing money? No  Feeding yourself? No  Getting from bed to chair? No  Climbing a flight of stairs? No  Preparing food and eating?: No  Bathing or showering? No  Getting dressed: No  Getting to the toilet? No  Using the toilet:No  Moving around from place to place: No  In the past year have you fallen or had a near fall?:No  Are you sexually active?  Yes Do you have more than one partner? No   Hearing Difficulties:  Do you often ask people to speak up or repeat themselves?  Yes Do you experience ringing or noises in your ears?  Yes Do you have difficulty understanding soft or whispered voices? No  Do you feel that you have a problem with memory?  Yes sometimes Do you often misplace items?  Yes   Home Safety:  Do you have a smoke alarm at your residence? Yes Do you have grab bars in the bathroom?  No Do you have throw rugs in your house?  No   Cognitive Testing  Alert? Yes Normal Appearance?Yes  Oriented to person? Yes Place? Yes  Time? Yes  Recall of three objects? Yes  Can perform simple calculations? Yes  Displays appropriate judgment?Yes  Can read the correct time from a watch face?Yes   List the Names of Other Physician/Practitioners you currently use:  See  list for the physicians patient is currently seeing.     Review of Systems: see above   Objective:     General appearance: Appears stated age and mildly obese  Head: Normocephalic, without obvious abnormality, atraumatic  Eyes: conj clear, EOMi PEERLA  Ears: normal TM's and external ear canals both ears  Nose: Nares normal. Septum midline. Mucosa normal. No drainage or sinus tenderness.   Throat: lips, mucosa, and tongue normal; teeth and gums normal  Neck: no adenopathy, no carotid bruit, no JVD, supple, symmetrical, trachea midline and thyroid not enlarged, symmetric, no tenderness/mass/nodules  No CVA tenderness.  Lungs: clear to auscultation bilaterally  Breasts: normal appearance, no masses or tenderness. Heart: regular rate and rhythm, S1, S2 normal, no murmur, click, rub or gallop  Abdomen: soft, non-tender; bowel sounds normal; no masses, no organomegaly  Musculoskeletal: ROM normal in all joints, no crepitus, no deformity, Normal muscle strengthen. Back  is symmetric, no curvature. Skin: Skin color, texture, turgor normal. No rashes or lesions  Lymph nodes: Cervical, supraclavicular, and axillary nodes normal.  Neurologic: CN 2 -12 Normal, Normal symmetric reflexes. Normal coordination and gait  Psych: Alert & Oriented x 3, Mood appear stable.    Assessment:    Annual wellness medicare exam   Plan:    During the course of the visit the patient was educated and counseled about appropriate screening and preventive services including:  COVID vaccines are up-to-date.  Records indicate she has had 1 Shingrix vaccine last Monday.  Needs to have 2 of these vaccines to be completely immunized against shingles(Herpes zoster)  Had Prevnar 13 in 2019.  Tetanus immunization is up-to-date.       Patient Instructions (the written plan) was given to the patient.  Medicare Attestation  I have personally reviewed:  The patient's medical and social history  Their use of alcohol, tobacco or illicit drugs  Their current medications and supplements  The patient's functional ability including ADLs,fall risks, home safety risks, cognitive, and hearing and visual impairment  Diet and physical activities  Evidence for depression or mood disorders  The patient's weight, height, BMI, and visual acuity have been recorded in the chart. I have made referrals, counseling, and  provided education to the patient based on review of the above and I have provided the patient with a written personalized care plan for preventive services.

## 2020-07-19 NOTE — Patient Instructions (Addendum)
Patient will work on diet and exercise and follow-up in 6 weeks.  We will see if we need to adjust diabetic medication at that time.

## 2020-07-20 LAB — URINE CULTURE
MICRO NUMBER:: 11743185
Result:: NO GROWTH
SPECIMEN QUALITY:: ADEQUATE

## 2020-07-20 LAB — URINALYSIS, MICROSCOPIC ONLY
Bacteria, UA: NONE SEEN /HPF
Hyaline Cast: NONE SEEN /LPF
RBC / HPF: NONE SEEN /HPF (ref 0–2)

## 2020-08-19 ENCOUNTER — Other Ambulatory Visit: Payer: Self-pay | Admitting: Internal Medicine

## 2020-08-28 ENCOUNTER — Other Ambulatory Visit: Payer: Self-pay

## 2020-08-28 ENCOUNTER — Other Ambulatory Visit: Payer: Medicare HMO | Admitting: Internal Medicine

## 2020-08-28 DIAGNOSIS — E1169 Type 2 diabetes mellitus with other specified complication: Secondary | ICD-10-CM

## 2020-08-28 DIAGNOSIS — E782 Mixed hyperlipidemia: Secondary | ICD-10-CM | POA: Diagnosis not present

## 2020-08-28 NOTE — Addendum Note (Signed)
Addended by: Gregery Na on: 08/28/2020 08:59 AM   Modules accepted: Orders

## 2020-08-29 LAB — HEMOGLOBIN A1C
Hgb A1c MFr Bld: 5.9 % of total Hgb — ABNORMAL HIGH (ref <5.7)
Mean Plasma Glucose: 123 mg/dL
eAG (mmol/L): 6.8 mmol/L

## 2020-08-30 ENCOUNTER — Encounter: Payer: Self-pay | Admitting: Internal Medicine

## 2020-08-30 ENCOUNTER — Other Ambulatory Visit: Payer: Self-pay

## 2020-08-30 ENCOUNTER — Ambulatory Visit (INDEPENDENT_AMBULATORY_CARE_PROVIDER_SITE_OTHER): Payer: Medicare HMO | Admitting: Internal Medicine

## 2020-08-30 VITALS — BP 110/80 | Ht 62.0 in | Wt 161.0 lb

## 2020-08-30 DIAGNOSIS — E119 Type 2 diabetes mellitus without complications: Secondary | ICD-10-CM

## 2020-08-30 LAB — POCT GLUCOSE (DEVICE FOR HOME USE): Glucose Fasting, POC: 116 mg/dL — AB (ref 70–99)

## 2020-08-30 MED ORDER — GLIPIZIDE ER 2.5 MG PO TB24: 2.5000 mg | ORAL_TABLET | Freq: Every day | ORAL | 3 refills | Status: DC

## 2020-08-30 NOTE — Progress Notes (Signed)
   Subjective:    Patient ID: Gwendolyn Herman, female    DOB: Dec 02, 1950, 70 y.o.   MRN: [id number]  HPI 70 year old Female here today to follow-up on medical issues including glucose intolerance, hypertension, hyperlipidemia.  In 2016 she weighed 171 and now weighs 161.  She tries to watch her diet and exercise.  Her only dietary indiscretion is  beer.  She is physically active.  She has 3 homes to take care of and 2 [place].  She was started on losartan in May 2011 for hypertension.  Lipitor was started in August 2011 for hyperlipidemia.    In 2015 her hemoglobin A1c was 6.3%.  She has been maintained on Metformin she has a history of hypertension treated with amlodipine and losartan HCTZ.  Blood pressure is stable on this regimen.  Her lipids are well controlled. Slight elevation of LDL at 103. Triglycerides improved from October 2021 from 186 to 83.  Metformin causes GI upset when taken twice daily so will need to back down to once a day. Spoke with patient day after visit ( May 21) about this.  Review of Systems- Has her Usual amount of stress taking care of her dogs and home repairs     Objective:   Physical Exam BP 110/80 weight 161 pounds BMI 29.45 Skin warm and dry Nodes none.  Chest is clear Cor RRR Ext without edema.      Assessment & Plan:  Essential hypertension stable on current regimen of losartan HCTZ and amlodipine  Mixed hyperlipidemia-patient currently on Lipitor 80 mg daily and lipid panel is excellent.  LDL is 103, triglycerides 83, HDL 61 and total cholesterol 181.  Type 2 diabetes mellitus/glucose intolerance-she cannot tolerate twice daily metformin.  I spoke with her about this by phone the day after the visit.  It causes GI upset.  We will try glipizide XL 2.5 mg daily and 1 metformin 500 mg in the morning.  She has a [employer] Accu-Chek machine and will monitor her Accu-Cheks.  She will follow-up in 3 months or  sooner if necessary.  BMI 29.45.  Continue to work on diet and exercise efforts.

## 2020-08-31 ENCOUNTER — Telehealth: Payer: Self-pay | Admitting: Internal Medicine

## 2020-08-31 MED ORDER — METFORMIN HCL 500 MG PO TABS: 1.0000 | ORAL_TABLET | Freq: Every day | ORAL | 3 refills | Status: DC

## 2020-08-31 NOTE — Telephone Encounter (Signed)
Is this the test strips? Please look into this

## 2020-08-31 NOTE — Telephone Encounter (Signed)
Arisbeth Purrington [phone]  [name] is at the pharmacy to pick up diabetic machine and was told she had prescription to pick up. She is confused about this, she stated she did not know about taking a new medicine, is this to replace only taking one metformin, and doing finger sticks for 3 months. She does not remember discussing new medication.

## 2020-08-31 NOTE — Telephone Encounter (Signed)
Spoke with patient this was about the glipizide, she will start glipizide.

## 2020-09-01 ENCOUNTER — Telehealth: Payer: Self-pay | Admitting: Internal Medicine

## 2020-09-01 ENCOUNTER — Encounter: Payer: Self-pay | Admitting: Internal Medicine

## 2020-09-01 DIAGNOSIS — E1169 Type 2 diabetes mellitus with other specified complication: Secondary | ICD-10-CM

## 2020-09-01 DIAGNOSIS — E8881 Metabolic syndrome: Secondary | ICD-10-CM

## 2020-09-01 DIAGNOSIS — E782 Mixed hyperlipidemia: Secondary | ICD-10-CM

## 2020-09-01 DIAGNOSIS — I10 Essential (primary) hypertension: Secondary | ICD-10-CM

## 2020-09-01 MED ORDER — METFORMIN HCL 500 MG PO TABS: ORAL_TABLET | ORAL | 3 refills | Status: DC

## 2020-09-01 MED ORDER — METFORMIN HCL 500 MG PO TABS: 500.0000 mg | ORAL_TABLET | Freq: Two times a day (BID) | ORAL | 3 refills | Status: DC

## 2020-09-01 NOTE — Telephone Encounter (Signed)
Sent in to pharmacy Metformin 500 mg twice daily as before and glipizide XL one po q am. Will contact patient to clarify this change.Follow up in 3 months or sooner if patient feels the need.  Addendum: Patient reminded me when she called that Metformin was causing GI upset. If she cannot tolerate twice daily dosage of Metformin, she may take Metformin once daily. We have discussed this and accucheck this am was 150. So, I will not change this script for metformin but she will have more than she needs right now.

## 2020-09-01 NOTE — Patient Instructions (Addendum)
Would like to see better glucose control. Metformin causes GI upset when taken twice daily. Only take once daily in am- clarified with patient by phone. Take Glipizide XL 2.5 mg daily in the mornings. Monitorr accuchecks and RTC in 3 months or sooner if necessary.BP is stable.Lipid panel is excellent.

## 2020-09-01 NOTE — Telephone Encounter (Signed)
I believe she is supposed to be on Metformin 500 mg twice daily and glipizide XL 2.5 mg daily. Recent Rx was for once daily Metformin. I will call pharamacy. MJB,MD

## 2020-09-08 ENCOUNTER — Encounter: Payer: Self-pay | Admitting: Internal Medicine

## 2020-09-17 ENCOUNTER — Other Ambulatory Visit: Payer: Self-pay

## 2020-09-17 ENCOUNTER — Telehealth: Payer: Self-pay | Admitting: Internal Medicine

## 2020-09-17 NOTE — Telephone Encounter (Signed)
Kyleen Villatoro [phone]  [name] called to say was to check in, and let you know how she was doing with new glucose machine and she don't think she is doing something right, some days her readings seem to be all over the place, should she take readings when she gets up, before meals after meals, 1 hour or 2 hours before or after meals. Sometime reading is really high and they have been really low. Most of the time she eats oatmeal for breakfast and 1/2 tuna sandwich for lunch. I suggested keeping journal of meals and glucose readings.  She also said this past Thursday when coming back from beach she did not have her normal coffee with caffeine because she was having an assessment with [facility]. When she had it they took her blood pressure several time and it was low 90 / 56 and she really had trouble staying awake driving back, could this be because she didn't have her normal coffee. I suggested she keep blood pressure log and discuss at office visit. She does not have blood pressure machine.

## 2020-09-18 NOTE — Telephone Encounter (Signed)
Called patient to let her know what Dr Lenord Fellers said, she wants to hold off for now, she is doing better since talking with me yesterday. She is going to monitor for a few days and see how she does, and see is she can find paper with dieticians name on it. I let her know I would be happy to put in a referral for her. She will call back if she decides she wants a referral.

## 2020-09-28 ENCOUNTER — Telehealth: Payer: Self-pay | Admitting: Internal Medicine

## 2020-09-28 NOTE — Telephone Encounter (Addendum)
FYI  Gwendolyn Herman [phone]  [name] called to say she only has 3 test strips left and when she went to the drug store to get more they told her insurance would not pay for 10 more days that she had run out too soon. She called the [employer] company and they are going to send her some but they want be there until Tuesday, so there will be a few days that she will not be able to test. She said she probably had wasted some because she was learning what to do and had gotten errors severally times and had retested. If she paid out of pocket it would be over $100.

## 2020-09-28 NOTE — Telephone Encounter (Signed)
Called patient to let her know what Dr Baxley said, she verbalized understanding. 

## 2020-11-19 ENCOUNTER — Other Ambulatory Visit: Payer: Self-pay | Admitting: Internal Medicine

## 2020-11-25 ENCOUNTER — Other Ambulatory Visit: Payer: Self-pay | Admitting: Internal Medicine

## 2020-11-29 ENCOUNTER — Other Ambulatory Visit: Payer: Medicare HMO | Admitting: Internal Medicine

## 2020-11-29 ENCOUNTER — Other Ambulatory Visit: Payer: Self-pay

## 2020-11-29 DIAGNOSIS — E119 Type 2 diabetes mellitus without complications: Secondary | ICD-10-CM | POA: Diagnosis not present

## 2020-11-30 ENCOUNTER — Encounter: Payer: Self-pay | Admitting: Internal Medicine

## 2020-11-30 ENCOUNTER — Ambulatory Visit (INDEPENDENT_AMBULATORY_CARE_PROVIDER_SITE_OTHER): Payer: Medicare HMO | Admitting: Internal Medicine

## 2020-11-30 VITALS — BP 110/80 | HR 72 | Ht 62.0 in | Wt 156.0 lb

## 2020-11-30 DIAGNOSIS — M5432 Sciatica, left side: Secondary | ICD-10-CM | POA: Diagnosis not present

## 2020-11-30 DIAGNOSIS — E782 Mixed hyperlipidemia: Secondary | ICD-10-CM | POA: Diagnosis not present

## 2020-11-30 DIAGNOSIS — E1169 Type 2 diabetes mellitus with other specified complication: Secondary | ICD-10-CM

## 2020-11-30 DIAGNOSIS — I10 Essential (primary) hypertension: Secondary | ICD-10-CM

## 2020-11-30 LAB — HEMOGLOBIN A1C
Hgb A1c MFr Bld: 5.8 % of total Hgb — ABNORMAL HIGH (ref <5.7)
Mean Plasma Glucose: 120 mg/dL
eAG (mmol/L): 6.6 mmol/L

## 2020-11-30 MED ORDER — MELOXICAM 15 MG PO TABS: 15.0000 mg | ORAL_TABLET | Freq: Every day | ORAL | 1 refills | Status: DC

## 2020-11-30 NOTE — Progress Notes (Signed)
   Subjective:    Patient ID: Gwendolyn Herman, female    DOB: 1950/05/20, 70 y.o.   MRN: [id number]  HPI 70 year old Female seen for follow up on impaired glucose tolerance.  She has a history of hypertension and hyperlipidemia.  Currently on losartan- HCTZ 100/25 daily, Lipitor 80 mg daily, metformin 500 mg daily.  Cannot tolerate twice daily dosage due to diarrhea.  She is also on glipizide XL 2.5 mg daily with breakfast.  Takes meloxicam 15 mg daily for musculoskeletal pain.  Has been trying to get more exercise and watching her diet.  Currently hemoglobin A1c is excellent at 5.8% on this regimen.  Has been as high as 6% going back to March 2020 and was 6% in April 2022.  She feels well and has situational stress of trying to take care of  2 homes, [place] as taking care of her dogs.  In May, hemoglobin A1c was 5.9%.  She was started on Metformin.  Only able to tolerate once daily dosing due to diarrhea.  Now here for 73-month follow-up.  She has had 4 COVID vaccines.  Tetanus immunization is up-to-date.   Issues with left-sided sciatica.  Meloxicam prescribed.  May need to see physical therapist if not improving   Has been exercising with [employer].   Review of Systems see above-no new complaints     Objective:   Physical Exam Blood pressure 110/80 pulse 72 pulse oximetry 98%, weight 156 pounds BMI 28.53 (she has lost 5 pounds since May)  Hemoglobin A1c excellent at 5.8%.  It was 5.9% in May.  Has normal muscle strength in her legs.    Assessment & Plan:  Impaired glucose tolerance stable and improved on metformin once daily.  Cannot tolerate twice daily due to diarrhea  Essential hypertension-stable on current regimen  BMI 28.53-has lost 5 pounds since last visit 3 months ago  Left-sided sciatica-take meloxicam and if not improving in a couple of weeks consider physical therapy.  Plan: She will continue with current regimen and  follow-up in November.

## 2020-11-30 NOTE — Patient Instructions (Addendum)
It was a pleasure to see you today. You are doing an excellent job with glucose control. RTC in November for follow up.

## 2021-01-25 ENCOUNTER — Other Ambulatory Visit: Payer: Self-pay | Admitting: Internal Medicine

## 2021-02-15 ENCOUNTER — Other Ambulatory Visit: Payer: Self-pay

## 2021-02-15 MED ORDER — LOSARTAN POTASSIUM-HCTZ 100-25 MG PO TABS: 1.0000 | ORAL_TABLET | Freq: Every day | ORAL | 1 refills | Status: DC

## 2021-02-28 ENCOUNTER — Other Ambulatory Visit: Payer: Medicare HMO | Admitting: Internal Medicine

## 2021-03-01 ENCOUNTER — Other Ambulatory Visit: Payer: Medicare HMO | Admitting: Internal Medicine

## 2021-03-04 ENCOUNTER — Ambulatory Visit: Payer: Medicare HMO | Admitting: Internal Medicine

## 2021-03-04 ENCOUNTER — Other Ambulatory Visit: Payer: Self-pay | Admitting: Internal Medicine

## 2021-03-04 DIAGNOSIS — Z1231 Encounter for screening mammogram for malignant neoplasm of breast: Secondary | ICD-10-CM

## 2021-03-26 ENCOUNTER — Other Ambulatory Visit: Payer: Self-pay

## 2021-03-26 ENCOUNTER — Other Ambulatory Visit: Payer: Medicare HMO | Admitting: Internal Medicine

## 2021-03-26 DIAGNOSIS — E782 Mixed hyperlipidemia: Secondary | ICD-10-CM

## 2021-03-26 DIAGNOSIS — E1169 Type 2 diabetes mellitus with other specified complication: Secondary | ICD-10-CM | POA: Diagnosis not present

## 2021-03-27 LAB — LIPID PANEL
Cholesterol: 173 mg/dL (ref <200)
HDL: 59 mg/dL (ref >=50)
LDL Cholesterol (Calc): 95 mg/dL (calc)
Non-HDL Cholesterol (Calc): 114 mg/dL (calc) (ref <130)
Total CHOL/HDL Ratio: 2.9 (calc) (ref <5.0)
Triglycerides: 96 mg/dL (ref <150)

## 2021-03-27 LAB — HEMOGLOBIN A1C
Hgb A1c MFr Bld: 6.1 % of total Hgb — ABNORMAL HIGH (ref <5.7)
Mean Plasma Glucose: 128 mg/dL
eAG (mmol/L): 7.1 mmol/L

## 2021-03-29 ENCOUNTER — Encounter: Payer: Self-pay | Admitting: Internal Medicine

## 2021-03-29 ENCOUNTER — Other Ambulatory Visit: Payer: Self-pay

## 2021-03-29 ENCOUNTER — Ambulatory Visit (INDEPENDENT_AMBULATORY_CARE_PROVIDER_SITE_OTHER): Payer: Medicare HMO | Admitting: Internal Medicine

## 2021-03-29 VITALS — BP 108/70 | HR 77 | Temp 98.5°F | Ht 62.0 in | Wt 168.0 lb

## 2021-03-29 DIAGNOSIS — Z780 Asymptomatic menopausal state: Secondary | ICD-10-CM | POA: Diagnosis not present

## 2021-03-29 NOTE — Progress Notes (Signed)
° °  Subjective:    Patient ID: Gwendolyn Herman, female    DOB: June 25, 1950, 70 y.o.   MRN: [id number]  HPI 70 year old Female for 6 month follow up. Has hyperlipidemia and glucose intolerance. Has been going to [facility] for exercise.  Currently on metformin 500 mg daily.  Cannot tolerate twice daily due to diarrhea.  History of hypertension treated with losartan HCTZ 100/25 daily and amlodipine 5 mg daily.  For hyperlipidemia is on atorvastatin 80 mg daily.  Her lipid panel is excellent.  Total cholesterol is 173.  HDL 59 triglycerides 96 and LDL is 95.  Unfortunately her hemoglobin A1c has increased from 5.8% in August to 6.1%.  She can only tolerate metformin once daily.  She is asking what else she can do to lower her hemoglobin A1c.  We are going to try glipizide 2.5 mg daily.  She will follow-up in May.  She is following a low calorie diet and she exercises a great deal.  If she does not respond to glipizide we can consider Januvia.  She is exercising consistently and I think tries to watch her diet.  However looks like she has gained some 12 pounds.     Review of Systems see above -has no new complaints     Objective:   Physical Exam Blood pressure 108/70 pulse 77 temperature 98.5 degrees pulse oximetry 95% weight 168 pounds height 5 feet 2 inches BMI 30.73  Skin warm and dry.  Neck is supple without JVD thyromegaly or carotid bruits.  Chest is clear.  Cardiac exam regular rate and rhythm.  No lower extremity pitting edema.     Assessment & Plan:  BMI 30.73-continue diet and exercise efforts  Impaired glucose tolerance can only take metformin once daily.  Will start on glipizide 2.5 mg daily.  Hemoglobin A1c currently 6.1%.  She would like to see at a lower.  Hyperlipidemia-excellent lipid panel on statin medication  Essential hypertension stable and under good control with losartan HCTZ 100/25 and amlodipine 5 mg daily.  Continue to monitor Accu-Cheks.  Return in May for  health maintenance exam and Medicare wellness visit.  Bone density study ordered.

## 2021-04-09 ENCOUNTER — Ambulatory Visit: Payer: Self-pay

## 2021-05-01 ENCOUNTER — Ambulatory Visit: Payer: Self-pay

## 2021-05-01 ENCOUNTER — Ambulatory Visit
Admission: RE | Admit: 2021-05-01 | Discharge: 2021-05-01 | Disposition: A | Discharge status: Home / Self Care | Payer: Medicare HMO | Source: Ambulatory Visit | Attending: Internal Medicine | Admitting: Internal Medicine

## 2021-05-01 DIAGNOSIS — Z1231 Encounter for screening mammogram for malignant neoplasm of breast: Secondary | ICD-10-CM | POA: Diagnosis not present

## 2021-05-09 ENCOUNTER — Other Ambulatory Visit: Payer: Self-pay | Admitting: Internal Medicine

## 2021-05-11 NOTE — Patient Instructions (Addendum)
Patient is starting on glipizide 2.5 mg daily.  Continue metformin once daily.  She will return in May for Medicare wellness and health maintenance exam.  Continue diet and exercise efforts.  Continue losartan HCTZ 100/25 and amlodipine 5 mg daily.  Continue to monitor Accu-Cheks.  I am pleased with her diet and exercise efforts.  Bone density study ordered.

## 2021-06-26 ENCOUNTER — Encounter: Payer: Self-pay | Admitting: Internal Medicine

## 2021-06-26 DIAGNOSIS — H5203 Hypermetropia, bilateral: Secondary | ICD-10-CM | POA: Diagnosis not present

## 2021-06-26 DIAGNOSIS — E119 Type 2 diabetes mellitus without complications: Secondary | ICD-10-CM | POA: Diagnosis not present

## 2021-06-26 DIAGNOSIS — H2513 Age-related nuclear cataract, bilateral: Secondary | ICD-10-CM | POA: Diagnosis not present

## 2021-06-26 DIAGNOSIS — H52203 Unspecified astigmatism, bilateral: Secondary | ICD-10-CM | POA: Diagnosis not present

## 2021-06-26 LAB — HM DIABETES EYE EXAM

## 2021-08-01 ENCOUNTER — Telehealth: Payer: Self-pay | Admitting: Internal Medicine

## 2021-08-01 NOTE — Telephone Encounter (Signed)
LVM to CB to see if we could move CPE to 08/22/2021 @3 :00pm ?

## 2021-08-02 NOTE — Telephone Encounter (Signed)
LVM to CB to reschedule CPE to 08/22/2021 @3  pm ?

## 2021-08-13 ENCOUNTER — Other Ambulatory Visit: Payer: Medicare HMO | Admitting: Internal Medicine

## 2021-08-16 ENCOUNTER — Encounter: Payer: Medicare HMO | Admitting: Internal Medicine

## 2021-08-19 ENCOUNTER — Other Ambulatory Visit: Payer: Medicare HMO

## 2021-08-23 ENCOUNTER — Other Ambulatory Visit: Payer: Self-pay | Admitting: Internal Medicine

## 2021-08-26 ENCOUNTER — Other Ambulatory Visit: Payer: Self-pay

## 2021-08-27 ENCOUNTER — Other Ambulatory Visit: Payer: Medicare HMO

## 2021-08-27 DIAGNOSIS — M2550 Pain in unspecified joint: Secondary | ICD-10-CM

## 2021-08-27 DIAGNOSIS — E119 Type 2 diabetes mellitus without complications: Secondary | ICD-10-CM | POA: Diagnosis not present

## 2021-08-27 DIAGNOSIS — R5383 Other fatigue: Secondary | ICD-10-CM | POA: Diagnosis not present

## 2021-08-27 DIAGNOSIS — I10 Essential (primary) hypertension: Secondary | ICD-10-CM

## 2021-08-27 DIAGNOSIS — E782 Mixed hyperlipidemia: Secondary | ICD-10-CM | POA: Diagnosis not present

## 2021-08-27 DIAGNOSIS — E1169 Type 2 diabetes mellitus with other specified complication: Secondary | ICD-10-CM

## 2021-08-27 NOTE — Addendum Note (Signed)
Addended by: Jama Flavors on: 08/27/2021 10:04 AM ? ? Modules accepted: Orders ? ?

## 2021-08-29 ENCOUNTER — Encounter: Payer: Self-pay | Admitting: Internal Medicine

## 2021-08-29 ENCOUNTER — Ambulatory Visit (INDEPENDENT_AMBULATORY_CARE_PROVIDER_SITE_OTHER): Payer: Medicare HMO | Admitting: Internal Medicine

## 2021-08-29 VITALS — BP 110/70 | HR 75 | Resp 20 | Ht 62.0 in | Wt 168.0 lb

## 2021-08-29 DIAGNOSIS — I10 Essential (primary) hypertension: Secondary | ICD-10-CM

## 2021-08-29 DIAGNOSIS — M7918 Myalgia, other site: Secondary | ICD-10-CM

## 2021-08-29 DIAGNOSIS — Z6830 Body mass index (BMI) 30.0-30.9, adult: Secondary | ICD-10-CM

## 2021-08-29 DIAGNOSIS — E1169 Type 2 diabetes mellitus with other specified complication: Secondary | ICD-10-CM | POA: Diagnosis not present

## 2021-08-29 DIAGNOSIS — Z0000 Encounter for general adult medical examination without abnormal findings: Secondary | ICD-10-CM | POA: Diagnosis not present

## 2021-08-29 DIAGNOSIS — F419 Anxiety disorder, unspecified: Secondary | ICD-10-CM

## 2021-08-29 DIAGNOSIS — R69 Illness, unspecified: Secondary | ICD-10-CM | POA: Diagnosis not present

## 2021-08-29 DIAGNOSIS — E782 Mixed hyperlipidemia: Secondary | ICD-10-CM

## 2021-08-29 LAB — COMPLETE METABOLIC PANEL WITH GFR
AG Ratio: 1.6 (calc) (ref 1.0–2.5)
ALT: 27 U/L (ref 6–29)
AST: 22 U/L (ref 10–35)
Albumin: 4.3 g/dL (ref 3.6–5.1)
Alkaline phosphatase (APISO): 64 U/L (ref 37–153)
BUN: 17 mg/dL (ref 7–25)
CO2: 27 mmol/L (ref 20–32)
Calcium: 9.7 mg/dL (ref 8.6–10.4)
Chloride: 106 mmol/L (ref 98–110)
Creat: 0.71 mg/dL (ref 0.60–1.00)
Globulin: 2.7 g/dL (calc) (ref 1.9–3.7)
Glucose, Bld: 110 mg/dL — ABNORMAL HIGH (ref 65–99)
Potassium: 4.4 mmol/L (ref 3.5–5.3)
Sodium: 139 mmol/L (ref 135–146)
Total Bilirubin: 0.5 mg/dL (ref 0.2–1.2)
Total Protein: 7 g/dL (ref 6.1–8.1)
eGFR: 91 mL/min/{1.73_m2} (ref >=60)

## 2021-08-29 LAB — CBC WITH DIFFERENTIAL/PLATELET
Absolute Monocytes: 379 cells/uL (ref 200–950)
Basophils Absolute: 29 cells/uL (ref 0–200)
Basophils Relative: 0.6 %
Eosinophils Absolute: 139 cells/uL (ref 15–500)
Eosinophils Relative: 2.9 %
HCT: 41.9 % (ref 35.0–45.0)
Hemoglobin: 14.2 g/dL (ref 11.7–15.5)
Lymphs Abs: 2107 cells/uL (ref 850–3900)
MCH: 31.8 pg (ref 27.0–33.0)
MCHC: 33.9 g/dL (ref 32.0–36.0)
MCV: 93.9 fL (ref 80.0–100.0)
MPV: 9.8 fL (ref 7.5–12.5)
Monocytes Relative: 7.9 %
Neutro Abs: 2146 cells/uL (ref 1500–7800)
Neutrophils Relative %: 44.7 %
Platelets: 306 10*3/uL (ref 140–400)
RBC: 4.46 10*6/uL (ref 3.80–5.10)
RDW: 11.9 % (ref 11.0–15.0)
Total Lymphocyte: 43.9 %
WBC: 4.8 10*3/uL (ref 3.8–10.8)

## 2021-08-29 LAB — ANA: Anti Nuclear Antibody (ANA): NEGATIVE

## 2021-08-29 LAB — POCT URINALYSIS DIPSTICK
Appearance: NORMAL
Bilirubin, UA: NEGATIVE
Blood, UA: NEGATIVE
Glucose, UA: NEGATIVE
Ketones, UA: NEGATIVE
Leukocytes, UA: NEGATIVE
Nitrite, UA: NEGATIVE
Odor: NORMAL
Protein, UA: NEGATIVE
Spec Grav, UA: 1.01 (ref 1.010–1.025)
Urobilinogen, UA: 0.2 E.U./dL
pH, UA: 6 (ref 5.0–8.0)

## 2021-08-29 LAB — LIPID PANEL
Cholesterol: 215 mg/dL — ABNORMAL HIGH (ref <200)
HDL: 68 mg/dL (ref >=50)
LDL Cholesterol (Calc): 111 mg/dL (calc) — ABNORMAL HIGH
Non-HDL Cholesterol (Calc): 147 mg/dL (calc) — ABNORMAL HIGH (ref <130)
Total CHOL/HDL Ratio: 3.2 (calc) (ref <5.0)
Triglycerides: 251 mg/dL — ABNORMAL HIGH (ref <150)

## 2021-08-29 LAB — MICROALBUMIN, URINE: Microalb, Ur: 1.1 mg/dL

## 2021-08-29 LAB — HEMOGLOBIN A1C
Hgb A1c MFr Bld: 5.9 % of total Hgb — ABNORMAL HIGH (ref <5.7)
Mean Plasma Glucose: 123 mg/dL
eAG (mmol/L): 6.8 mmol/L

## 2021-08-29 LAB — CYCLIC CITRUL PEPTIDE ANTIBODY, IGG: Cyclic Citrullin Peptide Ab: <16 UNITS

## 2021-08-29 LAB — SEDIMENTATION RATE: Sed Rate: 6 mm/h (ref 0–30)

## 2021-08-29 LAB — TSH: TSH: 1.56 mIU/L (ref 0.40–4.50)

## 2021-08-29 NOTE — Progress Notes (Signed)
   Subjective:    Patient ID: Gwendolyn Herman, female    DOB: 1951/04/03, 71 y.o.   MRN: [id number]  HPI 71 year old Female seen for Medicare wellness and health maintenance exam as well as evaluation of medical issues.  Patient has bilateral hand discomfort. Trying TaiChi and pilates but not much walking. 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  Has been working out quite a bit and may have overdone it a bit.  May need to cut back on activities.  History of impaired glucose tolerance, hypertension and hyperlipidemia.  History of de Quervain's tenosynovitis left arm from lifting a dog.  Was seen by Dr. Roanna Epley in June 2020.  Past medical history: Fractured right foot 1972, history of bilateral carpal tunnel syndrome, history of occasional anxiety, history of systolic murmur and had 2D echocardiogram in 2003-08-07 that was normal.  Started on losartan May 2011 for hypertension.  Lipitor was started in August 2011 for hyperlipidemia.  Social history: She is married.  Does not smoke.  Social alcohol consumption mainly consisting of beer.  She has adult children and grandchildren.  Family history: Father died in 06-Aug-2001 in his sleep at age 7 and had hypertension.  Mother died in 92 with possible CVA and with history of congestive heart failure and hypertension.  1 sister with history of hypertension, coronary stent placement and hyperlipidemia.    Review of Systems would like something besides Xanax for anxiety.     Objective:   Physical Exam  Blood pressure 110/70 pulse 75 respiratory rate 20 pulse oximetry 97% weight 168 pounds BMI 30.73      Assessment & Plan:   Essential hypertension-stable on amlodipine 5 mg daily and losartan/HCTZ 100/25 daily.  Type 2 diabetes mellitus treated with glipizide 2.5 mg daily with breakfast and metformin 500 mg twice daily.  Hemoglobin A1c stable at 5.9% and previously was 6.1% in December 2022.  Had diabetic eye exam by Dr. Emily Filbert in March  2023.  Musculoskeletal pain-may be related to amount of exercise she is doing.  However will check rheumatology studies.  ANA is negative.  CCP is negative for rheumatoid arthritis, sed rate low at 6.  Plan: Continue current regimen and follow-up in 6 months.  She is due for Prevnar 20 and may have that vaccine at local pharmacy.

## 2021-09-10 NOTE — Progress Notes (Signed)
Done

## 2021-09-10 NOTE — Addendum Note (Signed)
Addended by: Angus Seller on: 09/10/2021 04:15 PM   Modules accepted: Orders

## 2021-09-10 NOTE — Progress Notes (Unsigned)
     Annual Wellness Visit     Patient: Gwendolyn Herman, Female    DOB: [birth year], 71 y.o.   MRN: [id number] Visit Date: 08/29/2021  Chief Complaint  Patient presents with   Annual Exam   Subjective    Gwendolyn Herman is a 71 y.o. female who presents today for her Annual Wellness Visit.  HPI   Social History   Social History Narrative   Not on file    Patient Care Team: Baxley, Luanna Cole, MD as PCP - General (Internal Medicine)  Review of Systems   Objective    Vitals: BP 110/70 (BP Location: Left Arm, Patient Position: Sitting, Cuff Size: Normal)   Pulse 75   Resp 20   Ht 5\' 2"  (1.575 m)   Wt 168 lb (76.2 kg)   SpO2 97%   BMI 30.73 kg/m   Physical Exam   Most recent functional status assessment:    08/29/2021    3:30 PM  In your present state of health, do you have any difficulty performing the following activities:  Hearing? 0  Vision? 1  Difficulty concentrating or making decisions? 0  Walking or climbing stairs? 0  Dressing or bathing? 0  Doing errands, shopping? 0  Preparing Food and eating ? N  Using the Toilet? N  In the past six months, have you accidently leaked urine? N  Do you have problems with loss of bowel control? N  Managing your Medications? N  Managing your Finances? N  Housekeeping or managing your Housekeeping? N   Most recent fall risk assessment:    08/29/2021    3:28 PM  Fall Risk   Falls in the past year? 0  Number falls in past yr: 0  Injury with Fall? 0  Risk for fall due to : No Fall Risks  Follow up Falls evaluation completed    Most recent depression screenings:    08/29/2021    3:29 PM 07/19/2020   11:08 AM  PHQ 2/9 Scores  PHQ - 2 Score 0 0   Most recent cognitive screening:    08/29/2021    3:36 PM  6CIT Screen  What Year? 0 points  What month? 0 points  What time? 0 points  Count back from 20 0 points  Months in reverse 0 points  Repeat phrase 0 points  Total Score 0 points       Assessment &  Plan     Annual wellness visit done today including the all of the following: Reviewed patient's Family Medical History Reviewed and updated list of patient's medical providers Assessment of cognitive impairment was done Assessed patient's functional ability Established a written schedule for health screening services Health Risk Assessent Completed and Reviewed  Discussed health benefits of physical activity, and encouraged her to engage in regular exercise appropriate for her age and condition.           08/31/2021, CMA

## 2021-09-11 NOTE — Patient Instructions (Addendum)
It was a pleasure to see you today.  Your labs are stable.  Continue diet and exercise regimen.  Be careful not to overdo your exercise which can result in musculoskeletal pain.  Rheumatology studies are all normal.  Return in 6 months.

## 2021-10-01 ENCOUNTER — Other Ambulatory Visit: Payer: Self-pay | Admitting: Internal Medicine

## 2021-10-18 ENCOUNTER — Ambulatory Visit
Admission: RE | Admit: 2021-10-18 | Discharge: 2021-10-18 | Disposition: A | Discharge status: Home / Self Care | Payer: Medicare HMO | Source: Ambulatory Visit | Attending: Internal Medicine | Admitting: Internal Medicine

## 2021-10-18 DIAGNOSIS — M85851 Other specified disorders of bone density and structure, right thigh: Secondary | ICD-10-CM | POA: Diagnosis not present

## 2021-10-18 DIAGNOSIS — Z780 Asymptomatic menopausal state: Secondary | ICD-10-CM | POA: Diagnosis not present

## 2021-10-21 ENCOUNTER — Encounter: Payer: Self-pay | Admitting: Internal Medicine

## 2021-10-21 ENCOUNTER — Ambulatory Visit (INDEPENDENT_AMBULATORY_CARE_PROVIDER_SITE_OTHER): Payer: Medicare HMO | Admitting: Internal Medicine

## 2021-10-21 VITALS — BP 114/78 | HR 72 | Temp 98.3°F | Wt 169.5 lb

## 2021-10-21 DIAGNOSIS — R200 Anesthesia of skin: Secondary | ICD-10-CM | POA: Diagnosis not present

## 2021-10-21 DIAGNOSIS — M810 Age-related osteoporosis without current pathological fracture: Secondary | ICD-10-CM | POA: Diagnosis not present

## 2021-10-21 DIAGNOSIS — G4709 Other insomnia: Secondary | ICD-10-CM

## 2021-10-21 DIAGNOSIS — M8580 Other specified disorders of bone density and structure, unspecified site: Secondary | ICD-10-CM

## 2021-10-21 MED ORDER — IBANDRONATE SODIUM 150 MG PO TABS: 150.0000 mg | ORAL_TABLET | ORAL | 3 refills | Status: DC

## 2021-10-21 MED ORDER — ALPRAZOLAM 0.5 MG PO TABS: 0.5000 mg | ORAL_TABLET | Freq: Every evening | ORAL | 1 refills | Status: DC | PRN

## 2021-10-21 NOTE — Progress Notes (Signed)
scheduled

## 2021-10-21 NOTE — Progress Notes (Signed)
   Subjective:    Patient ID: Gwendolyn Herman, female    DOB: June 01, 1950, 71 y.o.   MRN: [id number]  HPI Patient is here today to discuss recent DEXA scan.  She has impaired glucose tolerance treated with metformin, hyperlipidemia and hypertension.  She continues to exercise at [facility].  Beginning to feel strong and fit.  Has some discomfort right third finger.  Thinks it may be carpal tunnel.  She will see Dr. Darrick Penna in the near future.  She previously had injection for carpal tunnel syndrome by him which helped.  T score right femoral neck is -1.4.  This is the lowest T score that she had in the entire bone density study.  Vitamin D level was drawn today.    Review of Systems see above     Objective:   Physical Exam  Blood pressure 114/78, pulse 72, temperature 98.3 degrees, pulse oximetry 98%, BMI 31.00  Sensation is intact right third finger.  Able to flex finger without much difficulty.  Bone density study reviewed.  Lowest score is -1.4 in right femoral neck.  She is interested in bone sparing therapy.      Assessment & Plan:  Osteopenia-right femoral neck lowest T score -1.4.  Try Boniva 150 mg monthly.  Vitamin D level drawn today.  Patient has history of right carpal tunnel syndrome and would like to see Dr. Roanna Epley regarding abnormal sensation right third finger.  Referral will be made.  Insomnia-would like to have something for insomnia on an as-needed basis to take sparingly.  Have prescribed Xanax 0.5 mg to take sparingly for insomnia such as with trips.  Plan: Has 65-month follow-up appointment here in November.

## 2021-10-21 NOTE — Patient Instructions (Addendum)
Takes Xanax 0.5 mg sparingly at bedtime for insomnia related to trips and/or mild anxiety.  Take Boniva 150 mg monthly.  Repeat bone density study in 2 years.  Referral to Dr. Darrick Penna regarding numbness right third finger.  Vitamin D level drawn today.  Has follow-up appointment here in November.

## 2021-10-22 ENCOUNTER — Other Ambulatory Visit: Payer: Self-pay

## 2021-10-22 LAB — VITAMIN D 25 HYDROXY (VIT D DEFICIENCY, FRACTURES): Vit D, 25-Hydroxy: 36 ng/mL (ref 30–100)

## 2021-10-22 MED ORDER — VITAMIN D (ERGOCALCIFEROL) 1.25 MG (50000 UNIT) PO CAPS: 50000.0000 [IU] | ORAL_CAPSULE | ORAL | 3 refills | Status: DC

## 2021-10-24 ENCOUNTER — Telehealth: Payer: Self-pay | Admitting: Internal Medicine

## 2021-10-24 NOTE — Telephone Encounter (Signed)
Gwendolyn Herman [id number]  [name] called to ask if she should be doing anything differently, like her [employer] classes. Should she be more careful, it is time to sign up for new classes and she does not want to do anything wrong.

## 2021-10-24 NOTE — Telephone Encounter (Signed)
LVM that she has no restrictions at this time, she may do what ever she feels like doing.

## 2021-11-14 ENCOUNTER — Ambulatory Visit: Payer: Self-pay

## 2021-11-14 ENCOUNTER — Ambulatory Visit: Payer: Medicare HMO | Admitting: Sports Medicine

## 2021-11-14 VITALS — BP 96/60 | Ht 62.0 in | Wt 167.0 lb

## 2021-11-14 DIAGNOSIS — M25531 Pain in right wrist: Secondary | ICD-10-CM | POA: Diagnosis not present

## 2021-11-14 DIAGNOSIS — G5601 Carpal tunnel syndrome, right upper limb: Secondary | ICD-10-CM

## 2021-11-14 NOTE — Assessment & Plan Note (Signed)
Cont Night splints Trial on GB at night If not improving we will inject However, with minimal pain I believe conservative approach indicated  Reck 1 mo

## 2021-11-14 NOTE — Progress Notes (Addendum)
PCP: Margaree Mackintosh, MD  Subjective:   HPI: Patient is a 71 y.o. female here for right hand numbness and tingling.  History of carpal tunnel on the right side. She now describes constant tingling in her 2nd and 3rd digits. No weakness. She has been wearing a carpal tunnel brace at night, which helps a little.  In past we did hydrodissection twice in 2018 and then 2019 Relief has lasted since then  Past Medical History:  Diagnosis Date   Bilateral carpal tunnel syndrome    Hyperlipidemia    Hypertension     Current Outpatient Medications on File Prior to Visit  Medication Sig Dispense Refill   ALPRAZolam (XANAX) 0.5 MG tablet Take 1 tablet (0.5 mg total) by mouth at bedtime as needed for anxiety. 30 tablet 1   amLODipine (NORVASC) 5 MG tablet TAKE ONE TABLET BY MOUTH EVERY MORNING 90 tablet 1   atorvastatin (LIPITOR) 80 MG tablet TAKE ONE TABLET BY MOUTH DAILY 90 tablet 1   glipiZIDE (GLUCOTROL XL) 2.5 MG 24 hr tablet TAKE ONE TABLET BY MOUTH DAILY WITH BREAKFAST 90 tablet 1   ibandronate (BONIVA) 150 MG tablet Take 1 tablet (150 mg total) by mouth every 30 (thirty) days. Take in the morning with a full glass of water, on an empty stomach, and do not take anything else by mouth or lie down for the next 30 min. 3 tablet 3   losartan-hydrochlorothiazide (HYZAAR) 100-25 MG tablet TAKE ONE TABLET BY MOUTH DAILY 90 tablet 1   metFORMIN (GLUCOPHAGE) 500 MG tablet One tab q am. Cannot tolerate twice daily dosage due to diarrhea 90 tablet 3   Vitamin D, Ergocalciferol, (DRISDOL) 1.25 MG (50000 UNIT) CAPS capsule Take 1 capsule (50,000 Units total) by mouth every 7 (seven) days. 4 capsule 3   No current facility-administered medications on file prior to visit.    Past Surgical History:  Procedure Laterality Date   BREAST BIOPSY Right     No Known Allergies  BP 96/60   Ht 5\' 2"  (1.575 m)   Wt 167 lb (75.8 kg)   BMI 30.54 kg/m       No data to display              No data to  display              Objective:  Physical Exam:  Gen: NAD, comfortable in exam room Wrist and hand, right: No obvious deformities. No TTP. 5/5 strength throughout. No obvious muscle atrophy. Positive Tinel's. Equivocal Phalen's.   Limited right wrist Ultrasound showed increased circumference and volume of the median nerve to 0.16 cm2 in short axis view.  Median nerve compression from the transverse palmar ligament visible on long axis view.  Comparison with left MN shows volume of 0.13 CM2  Impression: Right Carpal Tunnel syndrome  Ultrasound and interpretation by . Fields, MD    Assessment & Plan:  1. Right sided carpal tunnel. Continue to wear nightly splint. Start taking gabapentin 300 mg nightly and vitamin B6 daily. Follow up in 4 weeks. Can consider injection at that time if no improvement.   Sibyl Parr, MS4  I observed and examined the patient with the medical student and agree with assessment and plan.  Note reviewed and modified by me. Timoteo Expose, MD

## 2021-11-14 NOTE — Patient Instructions (Signed)
You were seen for right sided carpal tunnel. Continue to wear your splint at night and working your hand under warm water. Start taking gabapentin 300 mg nightly and vitamin B6 daily to help with nerve pain. Follow up in 1 month. If it is not improving we may consider an injection at that time.

## 2021-11-15 MED ORDER — GABAPENTIN 300 MG PO CAPS: 300.0000 mg | ORAL_CAPSULE | Freq: Every day | ORAL | 2 refills | Status: DC

## 2021-11-27 ENCOUNTER — Other Ambulatory Visit: Payer: Self-pay | Admitting: Internal Medicine

## 2022-02-05 ENCOUNTER — Other Ambulatory Visit (HOSPITAL_BASED_OUTPATIENT_CLINIC_OR_DEPARTMENT_OTHER): Payer: Self-pay

## 2022-02-05 MED ORDER — AREXVY 120 MCG/0.5ML IM SUSR
INTRAMUSCULAR | 0 refills | Status: DC
  Filled 2022-02-05: qty 0.5, 1d supply, fill #0

## 2022-02-08 ENCOUNTER — Other Ambulatory Visit: Payer: Self-pay | Admitting: Internal Medicine

## 2022-02-18 ENCOUNTER — Other Ambulatory Visit: Payer: Medicare HMO

## 2022-02-18 DIAGNOSIS — E1169 Type 2 diabetes mellitus with other specified complication: Secondary | ICD-10-CM

## 2022-02-18 DIAGNOSIS — E119 Type 2 diabetes mellitus without complications: Secondary | ICD-10-CM

## 2022-02-18 DIAGNOSIS — E782 Mixed hyperlipidemia: Secondary | ICD-10-CM | POA: Diagnosis not present

## 2022-02-19 LAB — HEMOGLOBIN A1C
Hgb A1c MFr Bld: 6.3 % of total Hgb — ABNORMAL HIGH (ref <5.7)
Mean Plasma Glucose: 134 mg/dL
eAG (mmol/L): 7.4 mmol/L

## 2022-02-19 LAB — LIPID PANEL
Cholesterol: 205 mg/dL — ABNORMAL HIGH (ref <200)
HDL: 67 mg/dL (ref >=50)
LDL Cholesterol (Calc): 111 mg/dL (calc) — ABNORMAL HIGH
Non-HDL Cholesterol (Calc): 138 mg/dL (calc) — ABNORMAL HIGH (ref <130)
Total CHOL/HDL Ratio: 3.1 (calc) (ref <5.0)
Triglycerides: 155 mg/dL — ABNORMAL HIGH (ref <150)

## 2022-02-20 ENCOUNTER — Other Ambulatory Visit: Payer: Self-pay | Admitting: Internal Medicine

## 2022-02-25 ENCOUNTER — Encounter: Payer: Self-pay | Admitting: Internal Medicine

## 2022-02-25 ENCOUNTER — Other Ambulatory Visit (HOSPITAL_BASED_OUTPATIENT_CLINIC_OR_DEPARTMENT_OTHER): Payer: Self-pay

## 2022-02-25 ENCOUNTER — Ambulatory Visit (INDEPENDENT_AMBULATORY_CARE_PROVIDER_SITE_OTHER): Payer: Medicare HMO | Admitting: Internal Medicine

## 2022-02-25 VITALS — BP 114/74 | HR 62 | Temp 98.8°F | Ht 62.0 in | Wt 173.8 lb

## 2022-02-25 DIAGNOSIS — E1169 Type 2 diabetes mellitus with other specified complication: Secondary | ICD-10-CM | POA: Diagnosis not present

## 2022-02-25 DIAGNOSIS — I10 Essential (primary) hypertension: Secondary | ICD-10-CM | POA: Diagnosis not present

## 2022-02-25 DIAGNOSIS — Z8249 Family history of ischemic heart disease and other diseases of the circulatory system: Secondary | ICD-10-CM

## 2022-02-25 DIAGNOSIS — E782 Mixed hyperlipidemia: Secondary | ICD-10-CM

## 2022-02-25 DIAGNOSIS — Z6831 Body mass index (BMI) 31.0-31.9, adult: Secondary | ICD-10-CM

## 2022-02-25 DIAGNOSIS — E119 Type 2 diabetes mellitus without complications: Secondary | ICD-10-CM

## 2022-02-25 MED ORDER — GLIPIZIDE ER 5 MG PO TB24
5.0000 mg | ORAL_TABLET | Freq: Every day | ORAL | 1 refills | Status: DC
  Filled 2022-02-25: qty 90, 90d supply, fill #0

## 2022-02-25 NOTE — Progress Notes (Unsigned)
   Subjective:    Patient ID: Gwendolyn Herman, female    DOB: Aug 19, 1950, 71 y.o.   MRN: [id number]  HPI  71 year old Female seen for follow up. Hgb AIC is elevated at 6.3%.  She also has developed some mild respiratory congestion. She is still concerned about her weight. Suggested referral to weight loss clinic.  She will consider it.  Newer medications may be recommended by weight loss clinic.  However patient wants to increase glipizide and follow-up in 3 months.  Her hemoglobin A1c is 6.3% and was 5.9% in May.  She continues to go to exercise classes regularly.  Her total cholesterol has improved from 215 in May to 205.  Triglycerides have improved considerably from 251 to 155.  LDL remains the same at 111.  Increasing atorvastatin to 80 mg daily.    Review of Systems no chest pain, SOB- exercises regularly and is active     Objective:   Physical Exam Blood pressure excellent at 114/74 pulse 62 temperature 98.8 degrees pulse oximetry 98% weight 173 pounds 12.8 ounces BMI 31.79 Skin warm and dry.  No cervical adenopathy or thyromegaly.  Chest clear.  Cardiac exam regular rate and rhythm.      Assessment & Plan:   Impaired glucose tolerance.  Patient wants to increase glipizide and follow-up here in 3 months.  Hyperlipidemia-improvement in triglycerides.  Trying to get LDL below 100.  Increasing atorvastatin to 80 mg daily.Follow up in 3 months  BMI 31- may benefit from weight loss clinic  HTN- stable on current regimen  Family hx of heart disease

## 2022-02-25 NOTE — Patient Instructions (Signed)
Glipizide has helped. Newer glucose lower agents discussed. Offered referral to weight loss clinic. They may have an easier time getting these meds approved. Patient wants to increase glipizide and follow up in 3 months.

## 2022-03-18 ENCOUNTER — Other Ambulatory Visit: Payer: Self-pay | Admitting: Internal Medicine

## 2022-03-18 DIAGNOSIS — Z1231 Encounter for screening mammogram for malignant neoplasm of breast: Secondary | ICD-10-CM

## 2022-05-13 ENCOUNTER — Ambulatory Visit
Admission: RE | Admit: 2022-05-13 | Discharge: 2022-05-13 | Disposition: A | Discharge status: Home / Self Care | Payer: Medicare HMO | Source: Ambulatory Visit | Attending: Internal Medicine | Admitting: Internal Medicine

## 2022-05-13 DIAGNOSIS — Z1231 Encounter for screening mammogram for malignant neoplasm of breast: Secondary | ICD-10-CM | POA: Diagnosis not present

## 2022-05-23 NOTE — Progress Notes (Signed)
Patient Care Team: Elby Showers, MD as PCP - General (Internal Medicine)  Visit Date: 05/29/22  Subjective:    Patient ID: Gwendolyn Herman , Female   DOB: 03/02/1951, 72 y.o.    MRN: [id number]   72 y.o. Female presents today for a 3 month A1C follow-up. Patient has a past medical history of Type 2 diabetes mellitus, hyperlipidemia, hypertension, bilateral carpal tunnel syndrome.  History of Type 2 diabetes mellitus previously treated with Glipizide XL 5 mg daily with breakfast, Glucophage 500 mg once daily in the morning. HGBA1C at 6.2 on 05/27/22, down from 6.3 on 02/18/22. Did not tolerate Glipizide XL 5 mg--reports upper and lower GI problems. She switched to Glipizide XL 2.5 mg twice daily without complications. She is interested in receiving a number to call for the [facility].   Past Medical History:  Diagnosis Date   Bilateral carpal tunnel syndrome    Hyperlipidemia    Hypertension      Family History  Problem Relation Age of Onset   Heart disease Mother    Stroke Mother    Cancer Father    Hyperlipidemia Sister    Hypertension Sister    Colon cancer Neg Hx    Esophageal cancer Neg Hx    Stomach cancer Neg Hx    Rectal cancer Neg Hx     Social History   Social History Narrative   Not on file      Review of Systems  Constitutional:  Negative for fever and malaise/fatigue.  HENT:  Negative for congestion.   Eyes:  Negative for blurred vision.  Respiratory:  Negative for cough and shortness of breath.   Cardiovascular:  Negative for chest pain, palpitations and leg swelling.  Gastrointestinal:  Negative for vomiting.  Musculoskeletal:  Negative for back pain.  Skin:  Negative for rash.  Neurological:  Negative for loss of consciousness and headaches.        Objective:   Vitals: BP 112/78   Pulse 71   Temp 97.7 F (36.5 C) (Tympanic)   Ht '5\' 2"'$  (1.575 m)   Wt 169 lb 6.4 oz (76.8 kg)   SpO2 98%   BMI 30.98 kg/m     Physical Exam Vitals and nursing note reviewed.  Constitutional:      General: She is not in acute distress.    Appearance: Normal appearance. She is not toxic-appearing.  HENT:     Head: Normocephalic and atraumatic.  Pulmonary:     Effort: Pulmonary effort is normal.  Skin:    General: Skin is warm and dry.  Neurological:     Mental Status: She is alert and oriented to person, place, and time. Mental status is at baseline.  Psychiatric:        Mood and Affect: Mood normal.        Behavior: Behavior normal.        Thought Content: Thought content normal.        Judgment: Judgment normal.       Results:   Studies obtained and personally reviewed by me:    Labs:       Component Value Date/Time   NA 139 08/27/2021 1016   K 4.4 08/27/2021 1016   CL 106 08/27/2021 1016   CO2 27 08/27/2021 1016   GLUCOSE 110 (H) 08/27/2021 1016   BUN 17 08/27/2021 1016   CREATININE 0.71 08/27/2021 1016   CALCIUM 9.7 08/27/2021 1016   PROT 7.0 08/27/2021  1016   ALBUMIN 4.0 10/21/2016 0905   AST 22 08/27/2021 1016   ALT 27 08/27/2021 1016   ALKPHOS 59 10/21/2016 0905   BILITOT 0.5 08/27/2021 1016   GFRNONAA 85 07/17/2020 0923   GFRAA 98 07/17/2020 0923     Lab Results  Component Value Date   WBC 4.8 08/27/2021   HGB 14.2 08/27/2021   HCT 41.9 08/27/2021   MCV 93.9 08/27/2021   PLT 306 08/27/2021    Lab Results  Component Value Date   CHOL 205 (H) 02/18/2022   HDL 67 02/18/2022   LDLCALC 111 (H) 02/18/2022   TRIG 155 (H) 02/18/2022   CHOLHDL 3.1 02/18/2022    Lab Results  Component Value Date   HGBA1C 6.2 (H) 05/27/2022     Lab Results  Component Value Date   TSH 1.56 08/27/2021      Assessment & Plan:   Type 2 diabetes mellitus: Previously treated with Glipizide XL 5 mg daily with breakfast, Glucophage 500 mg once daily in the morning. HGBA1C at 6.2 % on 05/27/22, down from 6.3% on 02/18/22. Did not tolerate Glipizide XL 5 mg--reports upper and lower GI  problems. She switched to Glipizide XL 2.5 mg twice daily without complications. Refilled Glipizide 2.5 mg twice daily. Given phone number to call for the [facility].  Essential hypertension stable on current regimen of amlodipine and losartan  Osteopenia treated with Boniva monthly  Hyperlipidemia treated with atorvastatin 80 mg daily.  Triglycerides slightly elevated at 155, LDL cholesterol 111 and total cholesterol 205.  HDL is 67.  Blood pressure is stable and she will continue diet and exercise efforts.  I,Alexander Ruley,acting as a Education administrator for Elby Showers, MD.,have documented all relevant documentation on the behalf of Elby Showers, MD,as directed by  Elby Showers, MD while in the presence of Elby Showers, MD.  I, Elby Showers, MD, have reviewed all documentation for this visit. The documentation on 06/09/22 for the exam, diagnosis, procedures, and orders are all accurate and complete.  30 minutes spent with patient

## 2022-05-27 ENCOUNTER — Other Ambulatory Visit: Payer: Self-pay | Admitting: Internal Medicine

## 2022-05-27 ENCOUNTER — Other Ambulatory Visit: Payer: Medicare HMO

## 2022-05-27 DIAGNOSIS — E119 Type 2 diabetes mellitus without complications: Secondary | ICD-10-CM | POA: Diagnosis not present

## 2022-05-28 LAB — HEMOGLOBIN A1C
Hgb A1c MFr Bld: 6.2 % of total Hgb — ABNORMAL HIGH (ref <5.7)
Mean Plasma Glucose: 131 mg/dL
eAG (mmol/L): 7.3 mmol/L

## 2022-05-28 LAB — MICROALBUMIN / CREATININE URINE RATIO
Creatinine, Urine: 23 mg/dL (ref 20–275)
Microalb Creat Ratio: 26 mcg/mg creat (ref <30)
Microalb, Ur: 0.6 mg/dL

## 2022-05-29 ENCOUNTER — Encounter: Payer: Self-pay | Admitting: Internal Medicine

## 2022-05-29 ENCOUNTER — Ambulatory Visit (INDEPENDENT_AMBULATORY_CARE_PROVIDER_SITE_OTHER): Payer: Medicare HMO | Admitting: Internal Medicine

## 2022-05-29 ENCOUNTER — Other Ambulatory Visit: Payer: Self-pay

## 2022-05-29 VITALS — BP 112/78 | HR 71 | Temp 97.7°F | Ht 62.0 in | Wt 169.4 lb

## 2022-05-29 DIAGNOSIS — E119 Type 2 diabetes mellitus without complications: Secondary | ICD-10-CM

## 2022-05-29 DIAGNOSIS — I10 Essential (primary) hypertension: Secondary | ICD-10-CM

## 2022-05-29 DIAGNOSIS — Z6830 Body mass index (BMI) 30.0-30.9, adult: Secondary | ICD-10-CM

## 2022-05-29 MED ORDER — GLIPIZIDE ER 5 MG PO TB24: 5.0000 mg | ORAL_TABLET | Freq: Every day | ORAL | 1 refills | Status: DC

## 2022-05-29 NOTE — Addendum Note (Signed)
Addended by: Geradine Girt D on: 05/29/2022 03:29 PM   Modules accepted: Orders

## 2022-06-09 NOTE — Patient Instructions (Addendum)
It was a pleasure to see you today.  Patient to take glipizide XL 2.5 mg twice daily as this has been tolerated.  Please contact [facility] for dietary counseling and weight loss education.  Please return in June for Medicare wellness visit and health maintenance exam.

## 2022-07-01 DIAGNOSIS — H524 Presbyopia: Secondary | ICD-10-CM | POA: Diagnosis not present

## 2022-07-01 DIAGNOSIS — H52203 Unspecified astigmatism, bilateral: Secondary | ICD-10-CM | POA: Diagnosis not present

## 2022-07-01 DIAGNOSIS — E119 Type 2 diabetes mellitus without complications: Secondary | ICD-10-CM | POA: Diagnosis not present

## 2022-07-01 LAB — HM DIABETES EYE EXAM

## 2022-07-02 ENCOUNTER — Encounter: Payer: Self-pay | Admitting: Internal Medicine

## 2022-08-04 ENCOUNTER — Telehealth: Payer: Self-pay | Admitting: Internal Medicine

## 2022-08-04 ENCOUNTER — Ambulatory Visit (INDEPENDENT_AMBULATORY_CARE_PROVIDER_SITE_OTHER): Payer: Medicare HMO | Admitting: Internal Medicine

## 2022-08-04 ENCOUNTER — Encounter: Payer: Self-pay | Admitting: Internal Medicine

## 2022-08-04 VITALS — BP 110/70 | HR 91 | Temp 101.8°F | Ht 62.0 in | Wt 169.0 lb

## 2022-08-04 DIAGNOSIS — J189 Pneumonia, unspecified organism: Secondary | ICD-10-CM | POA: Diagnosis not present

## 2022-08-04 DIAGNOSIS — R0989 Other specified symptoms and signs involving the circulatory and respiratory systems: Secondary | ICD-10-CM | POA: Diagnosis not present

## 2022-08-04 LAB — EXTRA SPECIMEN

## 2022-08-04 MED ORDER — BENZONATATE 100 MG PO CAPS: 100.0000 mg | ORAL_CAPSULE | Freq: Two times a day (BID) | ORAL | 0 refills | Status: DC | PRN

## 2022-08-04 MED ORDER — AZITHROMYCIN 250 MG PO TABS: ORAL_TABLET | ORAL | 0 refills | Status: AC

## 2022-08-04 MED ORDER — CEFTRIAXONE SODIUM 1 G IJ SOLR
1.0000 g | Freq: Once | INTRAMUSCULAR | Status: AC
  Administered 2022-08-04: 1 g via INTRAMUSCULAR

## 2022-08-04 NOTE — Progress Notes (Signed)
Patient Care Team: Margaree Mackintosh, MD as PCP - General (Internal Medicine)  Visit Date: 08/04/22  Subjective:    Patient ID: Gwendolyn Herman , Female   DOB: [birth year], 72 y.o.    MRN: [id number]   72 y.o. Female presents today for fatigue since 07/30/22. Fever and shaking chills started on 08/02/22. Denies cough, urinary symptoms, nausea, vomiting, diarrhea, sore throat. She has a decreased appetite. She is taking Tylenol.   Past Medical History:  Diagnosis Date   Bilateral carpal tunnel syndrome    Hyperlipidemia    Hypertension      Family History  Problem Relation Age of Onset   Heart disease Mother    Stroke Mother    Cancer Father    Hyperlipidemia Sister    Hypertension Sister    Colon cancer Neg Hx    Esophageal cancer Neg Hx    Stomach cancer Neg Hx    Rectal cancer Neg Hx     Social History   Social History Narrative   Not on file      Review of Systems  Constitutional:  Positive for chills, fever and malaise/fatigue.  HENT:  Negative for congestion.   Eyes:  Negative for blurred vision.  Respiratory:  Negative for cough and shortness of breath.   Cardiovascular:  Negative for chest pain, palpitations and leg swelling.  Gastrointestinal:  Negative for vomiting.  Genitourinary:  Negative for dysuria, flank pain, frequency, hematuria and urgency.  Musculoskeletal:  Negative for back pain.  Skin:  Negative for rash.  Neurological:  Negative for loss of consciousness and headaches.        Objective:   Vitals: BP 110/70   Pulse 91   Temp (!) 101.8 F (38.8 C) (Tympanic)   Ht  (1.575 m)   Wt 169 lb (76.7 kg)   SpO2 93%   BMI 30.91 kg/m    Physical Exam Vitals and nursing note reviewed.  Constitutional:      General: She is not in acute distress.    Appearance: Normal appearance. She is not toxic-appearing.  HENT:     Head: Normocephalic and atraumatic.     Right Ear: Hearing, tympanic membrane, ear canal and external ear normal.      Left Ear: Hearing, tympanic membrane, ear canal and external ear normal.     Mouth/Throat:     Pharynx: Oropharynx is clear. No oropharyngeal exudate.  Pulmonary:     Effort: Pulmonary effort is normal.     Comments: Coarse breath sounds in bilateral lower lobes. Not tachypneic. Skin:    General: Skin is warm and dry.  Neurological:     Mental Status: She is alert and oriented to person, place, and time. Mental status is at baseline.  Psychiatric:        Mood and Affect: Mood normal.        Behavior: Behavior normal.        Thought Content: Thought content normal.        Judgment: Judgment normal.       Results:   Studies obtained and personally reviewed by me:    Labs:       Component Value Date/Time   NA 139 08/27/2021 1016   K 4.4 08/27/2021 1016   CL 106 08/27/2021 1016   CO2 27 08/27/2021 1016   GLUCOSE 110 (H) 08/27/2021 1016   BUN 17 08/27/2021 1016   CREATININE 0.71 08/27/2021 1016   CALCIUM 9.7 08/27/2021 1016  PROT 7.0 08/27/2021 1016   ALBUMIN 4.0 10/21/2016 0905   AST 22 08/27/2021 1016   ALT 27 08/27/2021 1016   ALKPHOS 59 10/21/2016 0905   BILITOT 0.5 08/27/2021 1016   GFRNONAA 85 07/17/2020 0923   GFRAA 98 07/17/2020 0923     Lab Results  Component Value Date   WBC 4.8 08/27/2021   HGB 14.2 08/27/2021   HCT 41.9 08/27/2021   MCV 93.9 08/27/2021   PLT 306 08/27/2021    Lab Results  Component Value Date   CHOL 205 (H) 02/18/2022   HDL 67 02/18/2022   LDLCALC 111 (H) 02/18/2022   TRIG 155 (H) 02/18/2022   CHOLHDL 3.1 02/18/2022    Lab Results  Component Value Date   HGBA1C 6.2 (H) 05/27/2022     Lab Results  Component Value Date   TSH 1.56 08/27/2021      Assessment & Plan:   Lower respiratory infection: prescribed azithromycin Z-Pak two tabs day 1 followed by one tab days 2-5, Tessalon 100 mg twice daily as needed for cough. Administered Rocephin 1 g IM. Ordered CBC with Diff/Plat. Ordered chest Xray. I am suspicious she  has pneumonia. Can take Aleve, Tylenol.  Addendum April 23: She had to return today to have CBC recollected as it was drawn in the wrong collection tube yesterday. CXR shows right basilar infiltrate consistent with right lower lobe pneumonia. Linear subsegmental atelectasis vs. Scarring medial left lower lobe. Will plan to repeat after course of antibiotics are completed.    I,Alexander Ruley,acting as a Neurosurgeon for Margaree Mackintosh, MD.,have documented all relevant documentation on the behalf of Margaree Mackintosh, MD,as directed by  Margaree Mackintosh, MD while in the presence of Margaree Mackintosh, MD.   I, Margaree Mackintosh, MD, have reviewed all documentation for this visit. The documentation on 08/04/22 for the exam, diagnosis, procedures, and orders are all accurate and complete.

## 2022-08-04 NOTE — Telephone Encounter (Signed)
Scheduled

## 2022-08-04 NOTE — Telephone Encounter (Signed)
Patient called and said she has felt kinda sick since last Wednesday, Starting feeling much worse Saturday and has had a fever since. Took covid test today and it came back negative. She wants to be seen. When do you want to see her? She's fine with today or tomorrow

## 2022-08-04 NOTE — Patient Instructions (Signed)
Rest at home and stay well-hydrated.  Walk around some inside your home to keep lungs open.  Have chest x-ray tomorrow morning at [address] [facility].  Order has been placed.  Monitor pulse oximetry.  You have been given 1 g IM Rocephin in the office today.  Please start tonight a Zithromax Z-PAK with directions take 2 tabs on day 1 followed by 1 tab on days 2 through 5.  May take Tessalon Perles if needed for cough.  Return on Thursday for follow-up.  Call if symptoms worsen.

## 2022-08-05 ENCOUNTER — Ambulatory Visit
Admission: RE | Admit: 2022-08-05 | Discharge: 2022-08-05 | Disposition: A | Discharge status: Home / Self Care | Payer: Medicare HMO | Source: Ambulatory Visit | Attending: Internal Medicine | Admitting: Internal Medicine

## 2022-08-05 ENCOUNTER — Telehealth: Payer: Self-pay

## 2022-08-05 DIAGNOSIS — R0989 Other specified symptoms and signs involving the circulatory and respiratory systems: Secondary | ICD-10-CM

## 2022-08-05 DIAGNOSIS — R918 Other nonspecific abnormal finding of lung field: Secondary | ICD-10-CM | POA: Diagnosis not present

## 2022-08-05 DIAGNOSIS — J181 Lobar pneumonia, unspecified organism: Secondary | ICD-10-CM | POA: Diagnosis not present

## 2022-08-05 LAB — CBC WITH DIFFERENTIAL/PLATELET
Absolute Monocytes: 874 cells/uL (ref 200–950)
Basophils Absolute: 28 cells/uL (ref 0–200)
Basophils Relative: 0.3 %
Eosinophils Absolute: 120 cells/uL (ref 15–500)
Eosinophils Relative: 1.3 %
HCT: 37 % (ref 35.0–45.0)
Hemoglobin: 12.9 g/dL (ref 11.7–15.5)
Lymphs Abs: 1380 cells/uL (ref 850–3900)
MCH: 31.4 pg (ref 27.0–33.0)
MCHC: 34.9 g/dL (ref 32.0–36.0)
MCV: 90 fL (ref 80.0–100.0)
MPV: 9.7 fL (ref 7.5–12.5)
Monocytes Relative: 9.5 %
Neutro Abs: 6799 cells/uL (ref 1500–7800)
Neutrophils Relative %: 73.9 %
Platelets: 293 10*3/uL (ref 140–400)
RBC: 4.11 10*6/uL (ref 3.80–5.10)
RDW: 12.4 % (ref 11.0–15.0)
Total Lymphocyte: 15 %
WBC: 9.2 10*3/uL (ref 3.8–10.8)

## 2022-08-05 NOTE — Telephone Encounter (Signed)
Patient was notified of error made with labs and agreed to come in to have CBC w/ diff lab redrawn

## 2022-08-07 ENCOUNTER — Ambulatory Visit (INDEPENDENT_AMBULATORY_CARE_PROVIDER_SITE_OTHER): Payer: Medicare HMO | Admitting: Internal Medicine

## 2022-08-07 ENCOUNTER — Encounter: Payer: Self-pay | Admitting: Internal Medicine

## 2022-08-07 VITALS — BP 114/76 | HR 79 | Temp 98.7°F | Ht 62.0 in | Wt 155.0 lb

## 2022-08-07 DIAGNOSIS — J189 Pneumonia, unspecified organism: Secondary | ICD-10-CM | POA: Diagnosis not present

## 2022-08-07 LAB — EXTRA SPECIMEN

## 2022-08-07 NOTE — Progress Notes (Addendum)
Patient Care Team: Margaree Mackintosh, MD as PCP - General (Internal Medicine)  Visit Date: 08/07/22  Subjective:    Patient ID: Gwendolyn Herman , Female   DOB: 1950-09-19, 72 y.o.    MRN: [id number]   72 y.o. Female presents today for a follow-up regarding right lower lobe pneumonia. She was treated here for this on 08/04/22 with azithromycin, Tessalon, Rocephin. Compliant with azithromycin. Symptoms improved but she is having some headaches for which she is taking ibuprofen.   Past Medical History:  Diagnosis Date   Bilateral carpal tunnel syndrome    Hyperlipidemia    Hypertension      Family History  Problem Relation Age of Onset   Heart disease Mother    Stroke Mother    Cancer Father    Hyperlipidemia Sister    Hypertension Sister    Colon cancer Neg Hx    Esophageal cancer Neg Hx    Stomach cancer Neg Hx    Rectal cancer Neg Hx     Social History   Social History Narrative   Not on file      Review of Systems  Constitutional:  Negative for fever and malaise/fatigue.  HENT:  Negative for congestion.   Eyes:  Negative for blurred vision.  Respiratory:  Negative for cough and shortness of breath.   Cardiovascular:  Negative for chest pain, palpitations and leg swelling.  Gastrointestinal:  Negative for vomiting.  Musculoskeletal:  Negative for back pain.  Skin:  Negative for rash.  Neurological:  Positive for headaches. Negative for loss of consciousness.        Objective:   Vitals: BP 114/76   Pulse 79   Temp 98.7 F (37.1 C) (Tympanic)   Ht 5\' 2"  (1.575 m)   Wt 155 lb (70.3 kg)   SpO2 94%   BMI 28.35 kg/m    Physical Exam Vitals and nursing note reviewed.  Constitutional:      General: She is not in acute distress.    Appearance: Normal appearance. She is not toxic-appearing.  HENT:     Head: Normocephalic and atraumatic.  Pulmonary:     Effort: Pulmonary effort is normal. No respiratory distress.     Breath sounds: Normal breath  sounds. No wheezing or rales.  Skin:    General: Skin is warm and dry.  Neurological:     Mental Status: She is alert and oriented to person, place, and time. Mental status is at baseline.  Psychiatric:        Mood and Affect: Mood normal.        Behavior: Behavior normal.        Thought Content: Thought content normal.        Judgment: Judgment normal.       Results:   Studies obtained and personally reviewed by me:    Labs:       Component Value Date/Time   NA 139 08/27/2021 1016   K 4.4 08/27/2021 1016   CL 106 08/27/2021 1016   CO2 27 08/27/2021 1016   GLUCOSE 110 (H) 08/27/2021 1016   BUN 17 08/27/2021 1016   CREATININE 0.71 08/27/2021 1016   CALCIUM 9.7 08/27/2021 1016   PROT 7.0 08/27/2021 1016   ALBUMIN 4.0 10/21/2016 0905   AST 22 08/27/2021 1016   ALT 27 08/27/2021 1016   ALKPHOS 59 10/21/2016 0905   BILITOT 0.5 08/27/2021 1016   GFRNONAA 85 07/17/2020 0923   GFRAA 98 07/17/2020 0923  Lab Results  Component Value Date   WBC 9.2 08/05/2022   HGB 12.9 08/05/2022   HCT 37.0 08/05/2022   MCV 90.0 08/05/2022   PLT 293 08/05/2022    Lab Results  Component Value Date   CHOL 205 (H) 02/18/2022   HDL 67 02/18/2022   LDLCALC 111 (H) 02/18/2022   TRIG 155 (H) 02/18/2022   CHOLHDL 3.1 02/18/2022    Lab Results  Component Value Date   HGBA1C 6.2 (H) 05/27/2022     Lab Results  Component Value Date   TSH 1.56 08/27/2021      Assessment & Plan:   Right lower lobe pneumonia: continue to rest, drink plenty of water, eat well. Lungs clear on exam.   Return in 7 days for follow-up.    I,Alexander Ruley,acting as a Neurosurgeon for Margaree Mackintosh, MD.,have documented all relevant documentation on the behalf of Margaree Mackintosh, MD,as directed by  Margaree Mackintosh, MD while in the presence of Margaree Mackintosh, MD.   I, Margaree Mackintosh, MD, have reviewed all documentation for this visit. The documentation on 08/12/22 for the exam, diagnosis, procedures, and  orders are all accurate and complete.

## 2022-08-12 DIAGNOSIS — J189 Pneumonia, unspecified organism: Secondary | ICD-10-CM | POA: Insufficient documentation

## 2022-08-12 NOTE — Patient Instructions (Signed)
Patient Zithromax Z-PAK as prescribed.  Rest, stay well-hydrated, eat well.  Return in 7 days for follow-up.

## 2022-08-13 ENCOUNTER — Ambulatory Visit (INDEPENDENT_AMBULATORY_CARE_PROVIDER_SITE_OTHER): Payer: Medicare HMO | Admitting: Internal Medicine

## 2022-08-13 ENCOUNTER — Ambulatory Visit
Admission: RE | Admit: 2022-08-13 | Discharge: 2022-08-13 | Disposition: A | Discharge status: Home / Self Care | Payer: Medicare HMO | Source: Ambulatory Visit | Attending: Internal Medicine | Admitting: Internal Medicine

## 2022-08-13 DIAGNOSIS — I10 Essential (primary) hypertension: Secondary | ICD-10-CM

## 2022-08-13 DIAGNOSIS — J189 Pneumonia, unspecified organism: Secondary | ICD-10-CM | POA: Diagnosis not present

## 2022-08-13 DIAGNOSIS — F439 Reaction to severe stress, unspecified: Secondary | ICD-10-CM | POA: Diagnosis not present

## 2022-08-13 DIAGNOSIS — E119 Type 2 diabetes mellitus without complications: Secondary | ICD-10-CM

## 2022-08-13 MED ORDER — LEVOFLOXACIN 500 MG PO TABS: 500.0000 mg | ORAL_TABLET | Freq: Every day | ORAL | 0 refills | Status: AC

## 2022-08-13 NOTE — Progress Notes (Signed)
   Subjective:    Patient ID: Gwendolyn Herman, female    DOB: 10/11/1950, 72 y.o.   MRN: [id number]  HPI Patient seen on April 22 with fever and shaking chills that started on April 20.  She denied cough, vomiting, diarrhea, headache or sore throat.  She had decreased appetite.  She had malaise and fatigue.  She was diagnosed with acute lower respiratory infection and started on Zithromax Z-PAK and Tessalon Perles.  Was given 1 g IM Rocephin in the office.  CBC with differential was ordered and white count was 9200.  Chest x-ray showed linear subsegmental atelectasis versus scarring medial left lower lobe and right basilar infiltrate consistent with right lower lobe pneumonia.  Was advised to rest and take it easy.  She is now here for follow-up.  Is feeling some better but not 100% well.  She is anxious to go to the beach to take care of some landscaping issues and gardening.  She also has an elderly dog that is requiring a lot of attention right now.  Repeat chest x-ray ordered today shows significantly decreased right basilar opacity suggesting improving atelectasis or pneumonia although some residual density remains and continued radiographic follow-up is recommended.  She looks better.  She is not coughing at the present time.    Review of Systems no fever or shaking chills recently but is fatigued.     Objective:   Physical Exam  Currently afebrile.  Skin: Warm and dry.  She is not tachypnea.  No cervical adenopathy.  Still has some inspiratory rales right lower lobe.  Chest x-ray ordered- see above for report      Assessment & Plan:  Persistent right lower lobe pneumonia-explained to her that chest x-ray lags clinical improvement and I am pleased that she is getting better.  I do think she should continue to take it easy and not do any heavy gardening or exercise.  She will take Levaquin 500 milligrams daily for 7 days and I will see her again May 15.  Essential hypertension treated  with amlodipine and losartan/HCTZ 100/25  Impaired glucose tolerance treated with metformin and glipizide  Hyperlipidemia treated with Lipitor  Plan: Levaquin 500 milligrams daily for 7 days.  Continue to take it easy and not do heavy gardening or exercise and follow-up.  May 15.  Will plan to repeat chest x-ray around that time.  Time spent with patient today discussing pneumonia, lifestyle issues and situational stress is 30 minutes

## 2022-08-22 ENCOUNTER — Telehealth: Payer: Self-pay | Admitting: Internal Medicine

## 2022-08-22 NOTE — Telephone Encounter (Signed)
scheduled

## 2022-08-22 NOTE — Telephone Encounter (Signed)
Gwendolyn Herman called to say she has finished her medication and she is wandering if she needs to get another X-Ray and come in to follow up for her PNA

## 2022-08-23 ENCOUNTER — Other Ambulatory Visit: Payer: Self-pay | Admitting: Internal Medicine

## 2022-08-24 ENCOUNTER — Encounter: Payer: Self-pay | Admitting: Internal Medicine

## 2022-08-24 NOTE — Patient Instructions (Signed)
It was a pleasure to see you today.  Please continue to rest and take it easy.  Do not do heavy gardening or heavy exercise.  Take Levaquin 500 milligrams daily for 7 days.  Chest x-ray is not completely clear yet.  We will see you again in May 15 with plans to rex-ray at that time.

## 2022-08-27 ENCOUNTER — Ambulatory Visit: Payer: Medicare HMO | Admitting: Internal Medicine

## 2022-08-29 ENCOUNTER — Ambulatory Visit (INDEPENDENT_AMBULATORY_CARE_PROVIDER_SITE_OTHER): Payer: Medicare HMO | Admitting: Internal Medicine

## 2022-08-29 ENCOUNTER — Encounter: Payer: Self-pay | Admitting: Internal Medicine

## 2022-08-29 ENCOUNTER — Ambulatory Visit
Admission: RE | Admit: 2022-08-29 | Discharge: 2022-08-29 | Disposition: A | Discharge status: Home / Self Care | Payer: Medicare HMO | Source: Ambulatory Visit | Attending: Internal Medicine | Admitting: Internal Medicine

## 2022-08-29 VITALS — BP 118/80 | HR 75 | Temp 98.5°F | Ht 62.0 in | Wt 151.0 lb

## 2022-08-29 DIAGNOSIS — Z09 Encounter for follow-up examination after completed treatment for conditions other than malignant neoplasm: Secondary | ICD-10-CM | POA: Diagnosis not present

## 2022-08-29 DIAGNOSIS — J189 Pneumonia, unspecified organism: Secondary | ICD-10-CM

## 2022-08-29 DIAGNOSIS — F4321 Adjustment disorder with depressed mood: Secondary | ICD-10-CM | POA: Diagnosis not present

## 2022-08-29 NOTE — Progress Notes (Signed)
Patient Care Team: Margaree Mackintosh, MD as PCP - General (Internal Medicine)  Visit Date: 08/29/22  Subjective:    Patient ID: Gwendolyn Herman , Female   DOB: [birth year], 72 y.o.    MRN: [id number]   72 y.o. Female presents today for a right lower lobe pneumonia follow-up. Treated here previously with Levaquin 500 mg daily on 08/24/22. 08/13/22 X-ray showed improvement. She is grieving the loss of her dog.  Past Medical History:  Diagnosis Date   Bilateral carpal tunnel syndrome    Hyperlipidemia    Hypertension      Family History  Problem Relation Age of Onset   Heart disease Mother    Stroke Mother    Cancer Father    Hyperlipidemia Sister    Hypertension Sister    Colon cancer Neg Hx    Esophageal cancer Neg Hx    Stomach cancer Neg Hx    Rectal cancer Neg Hx     Social History   Social History Narrative   Not on file      Review of Systems  Constitutional:  Negative for fever and malaise/fatigue.  HENT:  Negative for congestion.   Eyes:  Negative for blurred vision.  Respiratory:  Negative for cough and shortness of breath.   Cardiovascular:  Negative for chest pain, palpitations and leg swelling.  Gastrointestinal:  Negative for vomiting.  Musculoskeletal:  Negative for back pain.  Skin:  Negative for rash.  Neurological:  Negative for loss of consciousness and headaches.        Objective:   Vitals: BP 118/80   Pulse 75   Temp 98.5 F (36.9 C) (Tympanic)   Ht 5\' 2"  (1.575 m)   Wt 151 lb (68.5 kg)   SpO2 97%   BMI 27.62 kg/m    Physical Exam Vitals and nursing note reviewed.  Constitutional:      General: She is not in acute distress.    Appearance: Normal appearance. She is not toxic-appearing.  HENT:     Head: Normocephalic and atraumatic.  Pulmonary:     Effort: Pulmonary effort is normal. No respiratory distress.     Breath sounds: Normal breath sounds. No wheezing or rales.  Skin:    General: Skin is warm and dry.  Neurological:      Mental Status: She is alert and oriented to person, place, and time. Mental status is at baseline.  Psychiatric:        Mood and Affect: Mood normal.        Behavior: Behavior normal.        Thought Content: Thought content normal.        Judgment: Judgment normal.       Results:   Studies obtained and personally reviewed by me:    Labs:       Component Value Date/Time   NA 139 08/27/2021 1016   K 4.4 08/27/2021 1016   CL 106 08/27/2021 1016   CO2 27 08/27/2021 1016   GLUCOSE 110 (H) 08/27/2021 1016   BUN 17 08/27/2021 1016   CREATININE 0.71 08/27/2021 1016   CALCIUM 9.7 08/27/2021 1016   PROT 7.0 08/27/2021 1016   ALBUMIN 4.0 10/21/2016 0905   AST 22 08/27/2021 1016   ALT 27 08/27/2021 1016   ALKPHOS 59 10/21/2016 0905   BILITOT 0.5 08/27/2021 1016   GFRNONAA 85 07/17/2020 0923   GFRAA 98 07/17/2020 0923     Lab Results  Component Value Date  WBC 9.2 08/05/2022   HGB 12.9 08/05/2022   HCT 37.0 08/05/2022   MCV 90.0 08/05/2022   PLT 293 08/05/2022    Lab Results  Component Value Date   CHOL 205 (H) 02/18/2022   HDL 67 02/18/2022   LDLCALC 111 (H) 02/18/2022   TRIG 155 (H) 02/18/2022   CHOLHDL 3.1 02/18/2022    Lab Results  Component Value Date   HGBA1C 6.2 (H) 05/27/2022     Lab Results  Component Value Date   TSH 1.56 08/27/2021      Assessment & Plan:   Right lower lobe pneumonia: longs clear on auscultation. She will be going for a follow-up CXR and I will call her with the results.    I,Alexander Ruley,acting as a Neurosurgeon for Margaree Mackintosh, MD.,have documented all relevant documentation on the behalf of Margaree Mackintosh, MD,as directed by  Margaree Mackintosh, MD while in the presence of Margaree Mackintosh, MD.   ***

## 2022-08-29 NOTE — Patient Instructions (Signed)
Condolences on the loss of you beloved dog. CXR obtained today with results pending in follow up of pneumonia. Results pending and I will call you leter today with results.  Addendum: CXR shows resolution of pneumonia

## 2022-09-26 NOTE — Progress Notes (Shared)
Annual Wellness Visit    Patient Care Team: Margaree Mackintosh, MD as PCP - General (Internal Medicine)  Visit Date: 10/03/22   Chief Complaint  Patient presents with   Medicare Wellness   She also presents for evaluation of medical issues  which include type 2 diabetes mellitus and hyperlipidemia as well as hypertension. Labs are reviewed with her today.  Fasting glucose is 118.  Hemoglobin A1c is 6.1% and was 6.2% in February 2024.  Kidney and liver functions are normal.  She exercises regularly. Subjective:   Patient: Gwendolyn Herman, Female    DOB: 29-May-1950, 72 y.o.   MRN: [id number]  Gwendolyn Herman is a 72 y.o. Female who presents today for her Annual Wellness Visit.  History of hyperlipidemia treated with atorvastatin 80 mg daily. Lipid panel normal.  History of Type 2 diabetes mellitus treated with metformin 500 mg daily in the morning, glipizide 5 mg daily with breakfast. HGBA1c at 6.1% on 10/02/22, up from 5.9% on 08/27/21.  History of hypertension treated with amlodipine 5 mg daily in the morning, losartan-hydrochlorothiazide 100-25 mg daily. Blood pressure normal today at 108/78  History of osteopenia treated with Boniva 150 mg every 30 days.  Last bone density study stable at -1.4 in femoral neck and 10/31/21.  History of Vitamin D deficiency treated with Vitamin D 50,000 units weekly.   History of de Quervain's tenosynovitis left arm from lifting a dog.  Was seen by Dr. Roanna Epley in June 2020.   Past medical history: Fractured right foot 1972, history of bilateral carpal tunnel syndrome, history of occasional issues with anxiety, history of systolic murmur and had 2D echocardiogram in 11/01/2003 that was normal.  Had eye exam 06/2022.  Glucose elevated at 118. Kidney, liver functions normal. Electrolytes normal. Blood proteins normal. CBC normal. TSH at 1.12.  Mammogram last completed 05/15/22. No mammographic evidence of malignancy. Recommended repeat in 11/01/23.    Social history: She is married to a Warden/ranger.  Does not smoke.  Social alcohol consumption mainly consisting of beer.  She has adult children and grandchildren.   Family history: Father died in 2001-10-31 in his sleep at age 54 and had history of hypertension.  Mother died in 64 with  with possible CVA and with history of congestive heart failure and hypertension.  1 sister with history of hypertension, coronary stent placement, and hyperlipidemia.  Past Medical History:  Diagnosis Date   Bilateral carpal tunnel syndrome    Hyperlipidemia    Hypertension      Family History  Problem Relation Age of Onset   Heart disease Mother    Stroke Mother    Cancer Father    Hyperlipidemia Sister    Hypertension Sister    Colon cancer Neg Hx    Esophageal cancer Neg Hx    Stomach cancer Neg Hx    Rectal cancer Neg Hx      Social History   Social History Narrative   Not on file     Review of Systems  Constitutional:  Negative for chills, fever, malaise/fatigue and weight loss.  HENT:  Negative for hearing loss, sinus pain and sore throat.   Respiratory:  Negative for cough, hemoptysis and shortness of breath.   Cardiovascular:  Negative for chest pain, palpitations, leg swelling and PND.  Gastrointestinal:  Negative for abdominal pain, constipation, diarrhea, heartburn, nausea and vomiting.  Genitourinary:  Negative for dysuria, frequency and urgency.  Musculoskeletal:  Negative for back pain,  myalgias and neck pain.  Skin:  Negative for itching and rash.  Neurological:  Negative for dizziness, tingling, seizures and headaches.  Endo/Heme/Allergies:  Negative for polydipsia.  Psychiatric/Behavioral:  Negative for depression. The patient is not nervous/anxious.       Objective:   Vitals: BP 108/78   Pulse 76   Temp 98.8 F (37.1 C) (Tympanic)   Resp 16   Ht 5' 1.75" (1.568 m)   Wt 157 lb 8 oz (71.4 kg)   SpO2 97%   BMI 29.04 kg/m   Physical Exam Vitals and nursing note  reviewed.  Constitutional:      General: She is not in acute distress.    Appearance: Normal appearance. She is not ill-appearing or toxic-appearing.  HENT:     Head: Normocephalic and atraumatic.     Right Ear: Hearing, tympanic membrane, ear canal and external ear normal.     Left Ear: Hearing, tympanic membrane, ear canal and external ear normal.     Mouth/Throat:     Pharynx: Oropharynx is clear.  Eyes:     Extraocular Movements: Extraocular movements intact.     Pupils: Pupils are equal, round, and reactive to light.  Neck:     Thyroid: No thyroid mass, thyromegaly or thyroid tenderness.     Vascular: No carotid bruit.  Cardiovascular:     Rate and Rhythm: Normal rate and regular rhythm. No extrasystoles are present.    Pulses:          Dorsalis pedis pulses are 1+ on the right side and 1+ on the left side.     Heart sounds: Normal heart sounds. No murmur heard.    No friction rub. No gallop.  Pulmonary:     Effort: Pulmonary effort is normal.     Breath sounds: Normal breath sounds. No decreased breath sounds, wheezing, rhonchi or rales.  Chest:     Chest wall: No mass.  Abdominal:     Palpations: Abdomen is soft. There is no hepatomegaly, splenomegaly or mass.     Tenderness: There is no abdominal tenderness.     Hernia: No hernia is present.  Genitourinary:    Comments: Bimanual exam normal. Musculoskeletal:     Cervical back: Normal range of motion.     Right lower leg: No edema.     Left lower leg: No edema.  Lymphadenopathy:     Cervical: No cervical adenopathy.     Upper Body:     Right upper body: No supraclavicular adenopathy.     Left upper body: No supraclavicular adenopathy.  Skin:    General: Skin is warm and dry.  Neurological:     General: No focal deficit present.     Mental Status: She is alert and oriented to person, place, and time. Mental status is at baseline.     Sensory: Sensation is intact.     Motor: Motor function is intact. No weakness.      Deep Tendon Reflexes: Reflexes are normal and symmetric.  Psychiatric:        Attention and Perception: Attention normal.        Mood and Affect: Mood normal.        Speech: Speech normal.        Behavior: Behavior normal.        Thought Content: Thought content normal.        Cognition and Memory: Cognition normal.        Judgment: Judgment normal.  Most recent functional status assessment:    10/03/2022   11:08 AM  In your present state of health, do you have any difficulty performing the following activities:  Hearing? 0  Vision? 0  Difficulty concentrating or making decisions? 0  Walking or climbing stairs? 0  Dressing or bathing? 0  Doing errands, shopping? 0   Most recent fall risk assessment:    10/03/2022   11:08 AM  Fall Risk   Falls in the past year? 0  Number falls in past yr: 0  Injury with Fall? 0  Risk for fall due to : No Fall Risks  Follow up Falls evaluation completed    Most recent depression screenings:    10/03/2022   11:09 AM 05/29/2022    2:19 PM  PHQ 2/9 Scores  PHQ - 2 Score 0 0   Most recent cognitive screening:    10/03/2022   11:09 AM  6CIT Screen  What Year? 0 points  What month? 0 points  What time? 0 points  Count back from 20 0 points  Months in reverse 2 points  Repeat phrase 0 points  Total Score 2 points     Results:   Studies obtained and personally reviewed by me:  Mammogram last completed 05/15/22. No mammographic evidence of malignancy. Recommended repeat in 2025.  Labs:       Component Value Date/Time   NA 140 10/02/2022 0952   K 4.1 10/02/2022 0952   CL 106 10/02/2022 0952   CO2 27 10/02/2022 0952   GLUCOSE 118 (H) 10/02/2022 0952   BUN 23 10/02/2022 0952   CREATININE 0.63 10/02/2022 0952   CALCIUM 9.5 10/02/2022 0952   PROT 6.8 10/02/2022 0952   ALBUMIN 4.0 10/21/2016 0905   AST 22 10/02/2022 0952   ALT 25 10/02/2022 0952   ALKPHOS 59 10/21/2016 0905   BILITOT 0.4 10/02/2022 0952   GFRNONAA  85 07/17/2020 0923   GFRAA 98 07/17/2020 0923     Lab Results  Component Value Date   WBC 4.6 10/02/2022   HGB 13.4 10/02/2022   HCT 40.0 10/02/2022   MCV 93.2 10/02/2022   PLT 304 10/02/2022    Lab Results  Component Value Date   CHOL 156 10/02/2022   HDL 58 10/02/2022   LDLCALC 83 10/02/2022   TRIG 66 10/02/2022   CHOLHDL 2.7 10/02/2022    Lab Results  Component Value Date   HGBA1C 6.1 (H) 10/02/2022     Lab Results  Component Value Date   TSH 1.12 10/02/2022    Assessment & Plan:   Hyperlipidemia: treated with atorvastatin 80 mg daily. Lipid panel normal.    Type 2 diabetes mellitus: treated with metformin 500 mg daily in the morning, glipizide 5 mg daily with breakfast. HGBA1c at 6.1% on 10/02/22, up from 5.9% on 08/27/21.  Hypertension: treated with amlodipine 5 mg daily in the morning, losartan-hydrochlorothiazide 100-25 mg daily. Blood pressure normal today at 108/78  Osteoporosis: treated with Boniva 150 mg every 30 days.  Vitamin D deficiency: treated with Vitamin D 50,000 units weekly.  Mammogram: last completed 05/15/22. No mammographic evidence of malignancy. Recommended repeat in 2025.  Pap deferred due to age.   Vaccine counseling: UTD on flu, shingles, pneumococcal 20, tetanus vaccines.  Return in 6 months for follow-up on diabetes and lipid management.  Health maintenance exam will be done in 1 year.     Annual wellness visit done today including the all of the following: Reviewed patient's Family Medical  History Reviewed and updated list of patient's medical providers Assessment of cognitive impairment was done Assessed patient's functional ability Established a written schedule for health screening services Health Risk Assessent Completed and Reviewed  Discussed health benefits of physical activity, and encouraged her to engage in regular exercise appropriate for her age and condition.        I,Alexander Ruley,acting as a Neurosurgeon for  Margaree Mackintosh, MD.,have documented all relevant documentation on the behalf of Margaree Mackintosh, MD,as directed by  Margaree Mackintosh, MD while in the presence of Margaree Mackintosh, MD.   I, Margaree Mackintosh, MD, have reviewed all documentation for this visit. The documentation on 10/08/22 for the exam, diagnosis, procedures, and orders are all accurate and complete.

## 2022-10-02 ENCOUNTER — Other Ambulatory Visit: Payer: Medicare HMO

## 2022-10-02 DIAGNOSIS — E1169 Type 2 diabetes mellitus with other specified complication: Secondary | ICD-10-CM | POA: Diagnosis not present

## 2022-10-02 DIAGNOSIS — E119 Type 2 diabetes mellitus without complications: Secondary | ICD-10-CM | POA: Diagnosis not present

## 2022-10-02 DIAGNOSIS — Z1329 Encounter for screening for other suspected endocrine disorder: Secondary | ICD-10-CM | POA: Diagnosis not present

## 2022-10-02 DIAGNOSIS — I10 Essential (primary) hypertension: Secondary | ICD-10-CM

## 2022-10-02 DIAGNOSIS — E782 Mixed hyperlipidemia: Secondary | ICD-10-CM | POA: Diagnosis not present

## 2022-10-02 LAB — TSH: TSH: 1.12 mIU/L (ref 0.40–4.50)

## 2022-10-02 LAB — COMPLETE METABOLIC PANEL WITH GFR
AG Ratio: 1.6 (calc) (ref 1.0–2.5)
ALT: 25 U/L (ref 6–29)
AST: 22 U/L (ref 10–35)
Albumin: 4.2 g/dL (ref 3.6–5.1)
Alkaline phosphatase (APISO): 63 U/L (ref 37–153)
BUN: 23 mg/dL (ref 7–25)
CO2: 27 mmol/L (ref 20–32)
Calcium: 9.5 mg/dL (ref 8.6–10.4)
Chloride: 106 mmol/L (ref 98–110)
Creat: 0.63 mg/dL (ref 0.60–1.00)
Globulin: 2.6 g/dL (calc) (ref 1.9–3.7)
Glucose, Bld: 118 mg/dL — ABNORMAL HIGH (ref 65–99)
Potassium: 4.1 mmol/L (ref 3.5–5.3)
Sodium: 140 mmol/L (ref 135–146)
Total Bilirubin: 0.4 mg/dL (ref 0.2–1.2)
Total Protein: 6.8 g/dL (ref 6.1–8.1)
eGFR: 95 mL/min/{1.73_m2} (ref >=60)

## 2022-10-02 LAB — CBC WITH DIFFERENTIAL/PLATELET
Absolute Monocytes: 432 cells/uL (ref 200–950)
Basophils Absolute: 41 cells/uL (ref 0–200)
Basophils Relative: 0.9 %
Eosinophils Absolute: 143 cells/uL (ref 15–500)
Eosinophils Relative: 3.1 %
HCT: 40 % (ref 35.0–45.0)
Hemoglobin: 13.4 g/dL (ref 11.7–15.5)
Lymphs Abs: 1863 cells/uL (ref 850–3900)
MCH: 31.2 pg (ref 27.0–33.0)
MCHC: 33.5 g/dL (ref 32.0–36.0)
MCV: 93.2 fL (ref 80.0–100.0)
MPV: 9.9 fL (ref 7.5–12.5)
Monocytes Relative: 9.4 %
Neutro Abs: 2121 cells/uL (ref 1500–7800)
Neutrophils Relative %: 46.1 %
Platelets: 304 10*3/uL (ref 140–400)
RBC: 4.29 10*6/uL (ref 3.80–5.10)
RDW: 12.6 % (ref 11.0–15.0)
Total Lymphocyte: 40.5 %
WBC: 4.6 10*3/uL (ref 3.8–10.8)

## 2022-10-02 LAB — LIPID PANEL
Cholesterol: 156 mg/dL (ref <200)
HDL: 58 mg/dL (ref >=50)
LDL Cholesterol (Calc): 83 mg/dL (calc)
Non-HDL Cholesterol (Calc): 98 mg/dL (calc) (ref <130)
Total CHOL/HDL Ratio: 2.7 (calc) (ref <5.0)
Triglycerides: 66 mg/dL (ref <150)

## 2022-10-02 LAB — HEMOGLOBIN A1C
Hgb A1c MFr Bld: 6.1 % of total Hgb — ABNORMAL HIGH (ref <5.7)
Mean Plasma Glucose: 128 mg/dL
eAG (mmol/L): 7.1 mmol/L

## 2022-10-03 ENCOUNTER — Ambulatory Visit (INDEPENDENT_AMBULATORY_CARE_PROVIDER_SITE_OTHER): Payer: Medicare HMO | Admitting: Internal Medicine

## 2022-10-03 ENCOUNTER — Encounter: Payer: Self-pay | Admitting: Internal Medicine

## 2022-10-03 VITALS — BP 108/78 | HR 76 | Temp 98.8°F | Resp 16 | Ht 61.75 in | Wt 157.5 lb

## 2022-10-03 DIAGNOSIS — Z0000 Encounter for general adult medical examination without abnormal findings: Secondary | ICD-10-CM

## 2022-10-03 DIAGNOSIS — Z8249 Family history of ischemic heart disease and other diseases of the circulatory system: Secondary | ICD-10-CM

## 2022-10-03 DIAGNOSIS — E119 Type 2 diabetes mellitus without complications: Secondary | ICD-10-CM | POA: Diagnosis not present

## 2022-10-03 DIAGNOSIS — G5601 Carpal tunnel syndrome, right upper limb: Secondary | ICD-10-CM

## 2022-10-03 DIAGNOSIS — Z6829 Body mass index (BMI) 29.0-29.9, adult: Secondary | ICD-10-CM

## 2022-10-03 DIAGNOSIS — F419 Anxiety disorder, unspecified: Secondary | ICD-10-CM

## 2022-10-03 DIAGNOSIS — I10 Essential (primary) hypertension: Secondary | ICD-10-CM

## 2022-10-03 DIAGNOSIS — Z7984 Long term (current) use of oral hypoglycemic drugs: Secondary | ICD-10-CM

## 2022-10-03 DIAGNOSIS — E1169 Type 2 diabetes mellitus with other specified complication: Secondary | ICD-10-CM

## 2022-10-03 DIAGNOSIS — E785 Hyperlipidemia, unspecified: Secondary | ICD-10-CM | POA: Diagnosis not present

## 2022-10-03 DIAGNOSIS — M85851 Other specified disorders of bone density and structure, right thigh: Secondary | ICD-10-CM | POA: Diagnosis not present

## 2022-10-03 DIAGNOSIS — G4709 Other insomnia: Secondary | ICD-10-CM

## 2022-10-03 LAB — POCT URINALYSIS DIPSTICK
Bilirubin, UA: NEGATIVE
Blood, UA: NEGATIVE
Glucose, UA: NEGATIVE
Ketones, UA: NEGATIVE
Leukocytes, UA: NEGATIVE
Nitrite, UA: NEGATIVE
Protein, UA: NEGATIVE
Spec Grav, UA: 1.015 (ref 1.010–1.025)
Urobilinogen, UA: 0.2 E.U./dL
pH, UA: 5 (ref 5.0–8.0)

## 2022-10-08 NOTE — Patient Instructions (Addendum)
It was a pleasure to see you today.  Labs are stable.  No change in medications.  Continue diet and exercise regimen.  Follow-up in 6 months for diabetic follow-up in 1 year for health maintenance exam.

## 2022-10-20 ENCOUNTER — Other Ambulatory Visit: Payer: Self-pay

## 2022-10-20 MED ORDER — IBANDRONATE SODIUM 150 MG PO TABS: 150.0000 mg | ORAL_TABLET | ORAL | 3 refills | Status: DC

## 2022-11-19 ENCOUNTER — Other Ambulatory Visit: Payer: Self-pay | Admitting: Sports Medicine

## 2022-11-27 ENCOUNTER — Other Ambulatory Visit: Payer: Self-pay

## 2022-11-27 MED ORDER — GLIPIZIDE ER 5 MG PO TB24: 5.0000 mg | ORAL_TABLET | Freq: Every day | ORAL | 1 refills | Status: DC

## 2022-12-23 DIAGNOSIS — G5601 Carpal tunnel syndrome, right upper limb: Secondary | ICD-10-CM | POA: Diagnosis not present

## 2022-12-25 MED ORDER — GLIPIZIDE ER 5 MG PO TB24: 5.0000 mg | ORAL_TABLET | Freq: Every day | ORAL | 1 refills | Status: DC

## 2022-12-25 NOTE — Telephone Encounter (Signed)
Patient called and said Gwendolyn Herman said they dont have the refill and she needs it sent in. Please advise

## 2022-12-25 NOTE — Addendum Note (Signed)
Addended by: Gregery Na on: 12/25/2022 02:19 PM   Modules accepted: Orders

## 2022-12-26 ENCOUNTER — Other Ambulatory Visit: Payer: Self-pay

## 2022-12-26 MED ORDER — GLIPIZIDE ER 2.5 MG PO TB24: ORAL_TABLET | ORAL | 0 refills | Status: DC

## 2022-12-26 NOTE — Telephone Encounter (Signed)
Patient states that her prescription for GLIPIZIDE was not the correct order. States she normally takes 2.5 MG twice a day instead of the once a day 5MG  dosage. Please advise  [facility] [id number] - [address] [place] - [address]

## 2022-12-30 DIAGNOSIS — G5601 Carpal tunnel syndrome, right upper limb: Secondary | ICD-10-CM | POA: Diagnosis not present

## 2023-01-06 DIAGNOSIS — G5601 Carpal tunnel syndrome, right upper limb: Secondary | ICD-10-CM | POA: Diagnosis not present

## 2023-01-16 DIAGNOSIS — G5601 Carpal tunnel syndrome, right upper limb: Secondary | ICD-10-CM | POA: Diagnosis not present

## 2023-01-27 DIAGNOSIS — G5601 Carpal tunnel syndrome, right upper limb: Secondary | ICD-10-CM | POA: Diagnosis not present

## 2023-02-19 ENCOUNTER — Other Ambulatory Visit: Payer: Self-pay

## 2023-02-19 MED ORDER — AMLODIPINE BESYLATE 5 MG PO TABS: 5.0000 mg | ORAL_TABLET | Freq: Every morning | ORAL | 1 refills | Status: DC

## 2023-02-19 MED ORDER — LOSARTAN POTASSIUM-HCTZ 100-25 MG PO TABS: 1.0000 | ORAL_TABLET | Freq: Every day | ORAL | 1 refills | Status: DC

## 2023-02-19 MED ORDER — ATORVASTATIN CALCIUM 80 MG PO TABS: 80.0000 mg | ORAL_TABLET | Freq: Every day | ORAL | 1 refills | Status: DC

## 2023-03-24 ENCOUNTER — Other Ambulatory Visit: Payer: Medicare HMO

## 2023-03-24 ENCOUNTER — Other Ambulatory Visit: Payer: Self-pay

## 2023-03-24 DIAGNOSIS — E1169 Type 2 diabetes mellitus with other specified complication: Secondary | ICD-10-CM

## 2023-03-24 DIAGNOSIS — E119 Type 2 diabetes mellitus without complications: Secondary | ICD-10-CM | POA: Diagnosis not present

## 2023-03-24 DIAGNOSIS — E782 Mixed hyperlipidemia: Secondary | ICD-10-CM | POA: Diagnosis not present

## 2023-03-24 MED ORDER — GLIPIZIDE ER 2.5 MG PO TB24: ORAL_TABLET | ORAL | 3 refills | Status: DC

## 2023-03-24 NOTE — Progress Notes (Signed)
Patient Care Team: Margaree Mackintosh, MD as PCP - General (Internal Medicine)  Visit Date: 03/31/23  Subjective:    Patient ID: Gwendolyn Herman , Female   DOB: 03-Feb-1951, 72 y.o.    MRN: [id number]   72 y.o. Female presents today for a 80-month follow up.   History of hyperlipidemia treated with atorvastatin 80 mg daily. Lipid panel normal. Liver functions normal.   History of Type 2 diabetes mellitus treated with metformin 500 mg daily in the morning, glipizide 5 mg daily with breakfast. HGBA1c at 6.2% on 03/24/23, up from  6.1% on 10/02/22.    History of hypertension treated with amlodipine 5 mg daily in the morning, losartan-hydrochlorothiazide 100-25 mg daily. Blood pressure normal today at 108/78   History of osteopenia treated with Boniva 150 mg every 30 days.  Last bone density study stable at -1.4 in femoral neck and July 2023.   History of Vitamin D deficiency treated with Vitamin D 50,000 units weekly.   History of de Quervain's tenosynovitis left arm from lifting a dog.  Was seen by Dr. Roanna Epley in June 2020.  History of Carpal Tunnel syndrome- she is S/P a carpal tunnel release on her right hand, done on Oct 4th 2024. She reports that she is healing well, and still has an a couple weeks of healing.   Past medical history: Fractured right foot 1972, history of bilateral carpal tunnel syndrome, history of occasional issues with anxiety, history of systolic murmur and had 2D echocardiogram in 2005 that was normal.   Had eye exam 06/2022.   Glucose elevated at 118. Kidney, liver functions normal. Electrolytes normal. Blood proteins normal. CBC normal. TSH at 1.12.   Mammogram last completed 05/15/22. No mammographic evidence of malignancy. Recommended repeat in 2025.  Vaccine Counseling: UTD on Flu, RSV, & Tetanus Past Medical History:  Diagnosis Date   Bilateral carpal tunnel syndrome    Hyperlipidemia    Hypertension      Family History  Problem Relation Age of  Onset   Heart disease Mother    Stroke Mother    Cancer Father    Hyperlipidemia Sister    Hypertension Sister    Colon cancer Neg Hx    Esophageal cancer Neg Hx    Stomach cancer Neg Hx    Rectal cancer Neg Hx     Social Hx: reviewed and unchanged. Married has several dogs. Husband is a retired Warden/ranger. Has adult children. [place]     Review of Systems  Constitutional:  Negative for fever and malaise/fatigue.  HENT:  Negative for congestion.   Eyes:  Negative for blurred vision.  Respiratory:  Negative for cough and shortness of breath.   Cardiovascular:  Negative for chest pain, palpitations and leg swelling.  Gastrointestinal:  Negative for vomiting.  Musculoskeletal:  Negative for back pain.  Skin:  Negative for rash.  Neurological:  Negative for loss of consciousness and headaches.      Objective:   Vitals: BP 120/80   Pulse 77   Ht 5' 1.75" (1.568 m)   Wt 166 lb 6 oz (75.5 kg)   SpO2 97%   BMI 30.68 kg/m    Physical Exam Vitals and nursing note reviewed.  Constitutional:      General: She is not in acute distress.    Appearance: Normal appearance. She is not toxic-appearing.  HENT:     Head: Normocephalic and atraumatic.  Neck:  Thyroid: No thyromegaly.     Vascular: No carotid bruit.  Cardiovascular:     Rate and Rhythm: Normal rate and regular rhythm. No extrasystoles are present.    Pulses: Normal pulses.     Heart sounds: Normal heart sounds. No murmur heard.    No friction rub. No gallop.  Pulmonary:     Effort: Pulmonary effort is normal. No respiratory distress.     Breath sounds: Normal breath sounds. No wheezing or rales.  Musculoskeletal:     Cervical back: Neck supple.  Lymphadenopathy:     Cervical: No cervical adenopathy.  Skin:    General: Skin is warm and dry.  Neurological:     Mental Status: She is alert and oriented to person, place, and time. Mental status is at baseline.  Psychiatric:         Mood and Affect: Mood normal.        Behavior: Behavior normal.        Thought Content: Thought content normal.        Judgment: Judgment normal.    Results:   Studies obtained and personally reviewed by me:  Glucose elevated at 118. Kidney, liver functions normal. Electrolytes normal. Blood proteins normal. CBC normal. TSH at 1.12.   Mammogram last completed 05/15/22. No mammographic evidence of malignancy. Recommended repeat in 2025.   Labs:       Component Value Date/Time   NA 140 10/02/2022 0952   K 4.1 10/02/2022 0952   CL 106 10/02/2022 0952   CO2 27 10/02/2022 0952   GLUCOSE 118 (H) 10/02/2022 0952   BUN 23 10/02/2022 0952   CREATININE 0.63 10/02/2022 0952   CALCIUM 9.5 10/02/2022 0952   PROT 6.9 03/24/2023 0951   ALBUMIN 4.0 10/21/2016 0905   AST 20 03/24/2023 0951   ALT 23 03/24/2023 0951   ALKPHOS 59 10/21/2016 0905   BILITOT 1.0 03/24/2023 0951   GFRNONAA 85 07/17/2020 0923   GFRAA 98 07/17/2020 0923     Lab Results  Component Value Date   WBC 4.6 10/02/2022   HGB 13.4 10/02/2022   HCT 40.0 10/02/2022   MCV 93.2 10/02/2022   PLT 304 10/02/2022    Lab Results  Component Value Date   CHOL 179 03/24/2023   HDL 61 03/24/2023   LDLCALC 95 03/24/2023   TRIG 131 03/24/2023   CHOLHDL 2.9 03/24/2023    Lab Results  Component Value Date   HGBA1C 6.2 (H) 03/24/2023     Lab Results  Component Value Date   TSH 1.12 10/02/2022      Assessment & Plan:   Hyperlipidemia: treated with atorvastatin 80 mg daily. Lipid panel normal. Liver functions normal.   Type 2 Diabetes Mellitus: treated with metformin 500 mg daily in the morning, glipizide 5 mg daily with breakfast. HGBA1c at 6.2% on 03/24/23, up from  6.1% on 10/02/22.    Hypertension: treated with amlodipine 5 mg daily in the morning, losartan-hydrochlorothiazide 100-25 mg daily. Blood pressure normal today at 108/78   Osteopenia: treated with Boniva 150 mg every 30 days.  Last bone density study  stable at -1.4 in femoral neck and July 2023.   Vitamin D Deficiency: treated with Vitamin D 50,000 units weekly.  History of Carpal Tunnel: S/P carpal tunnel release on her right hand, done on Oct 4th 2024. Healing well.    Vaccine Counseling: UTD on Flu, RSV, & Tetanus  Return in 6 months for next check-up, or as needed!  I,Alexander Ruley,acting as  a scribe for Margaree Mackintosh, MD.,have documented all relevant documentation on the behalf of Margaree Mackintosh, MD,as directed by  Margaree Mackintosh, MD while in the presence of Margaree Mackintosh, MD.   I, Margaree Mackintosh, MD, have reviewed all documentation for this visit. The documentation on 04/01/23 for the exam, diagnosis, procedures, and orders are all accurate and complete.

## 2023-03-25 LAB — HEPATIC FUNCTION PANEL
AG Ratio: 1.6 (calc) (ref 1.0–2.5)
ALT: 23 U/L (ref 6–29)
AST: 20 U/L (ref 10–35)
Albumin: 4.2 g/dL (ref 3.6–5.1)
Alkaline phosphatase (APISO): 76 U/L (ref 37–153)
Bilirubin, Direct: 0.2 mg/dL (ref 0.0–0.2)
Globulin: 2.7 g/dL (ref 1.9–3.7)
Indirect Bilirubin: 0.8 mg/dL (ref 0.2–1.2)
Total Bilirubin: 1 mg/dL (ref 0.2–1.2)
Total Protein: 6.9 g/dL (ref 6.1–8.1)

## 2023-03-25 LAB — HEMOGLOBIN A1C
Hgb A1c MFr Bld: 6.2 %{Hb} — ABNORMAL HIGH (ref <5.7)
Mean Plasma Glucose: 131 mg/dL
eAG (mmol/L): 7.3 mmol/L

## 2023-03-25 LAB — LIPID PANEL
Cholesterol: 179 mg/dL (ref <200)
HDL: 61 mg/dL (ref >=50)
LDL Cholesterol (Calc): 95 mg/dL
Non-HDL Cholesterol (Calc): 118 mg/dL (ref <130)
Total CHOL/HDL Ratio: 2.9 (calc) (ref <5.0)
Triglycerides: 131 mg/dL (ref <150)

## 2023-03-26 ENCOUNTER — Other Ambulatory Visit: Payer: Medicare HMO

## 2023-03-31 ENCOUNTER — Encounter: Payer: Self-pay | Admitting: Internal Medicine

## 2023-03-31 ENCOUNTER — Other Ambulatory Visit: Payer: Self-pay

## 2023-03-31 ENCOUNTER — Other Ambulatory Visit (HOSPITAL_BASED_OUTPATIENT_CLINIC_OR_DEPARTMENT_OTHER): Payer: Self-pay

## 2023-03-31 ENCOUNTER — Ambulatory Visit (INDEPENDENT_AMBULATORY_CARE_PROVIDER_SITE_OTHER): Payer: Medicare HMO | Admitting: Internal Medicine

## 2023-03-31 VITALS — BP 120/80 | HR 77 | Ht 61.75 in | Wt 166.4 lb

## 2023-03-31 DIAGNOSIS — E119 Type 2 diabetes mellitus without complications: Secondary | ICD-10-CM | POA: Diagnosis not present

## 2023-03-31 DIAGNOSIS — M8580 Other specified disorders of bone density and structure, unspecified site: Secondary | ICD-10-CM | POA: Diagnosis not present

## 2023-03-31 DIAGNOSIS — E1169 Type 2 diabetes mellitus with other specified complication: Secondary | ICD-10-CM

## 2023-03-31 DIAGNOSIS — E782 Mixed hyperlipidemia: Secondary | ICD-10-CM | POA: Diagnosis not present

## 2023-03-31 DIAGNOSIS — E559 Vitamin D deficiency, unspecified: Secondary | ICD-10-CM

## 2023-03-31 DIAGNOSIS — Z23 Encounter for immunization: Secondary | ICD-10-CM | POA: Diagnosis not present

## 2023-03-31 DIAGNOSIS — I10 Essential (primary) hypertension: Secondary | ICD-10-CM

## 2023-03-31 MED ORDER — METFORMIN HCL 500 MG PO TABS
500.0000 mg | ORAL_TABLET | Freq: Every morning | ORAL | 3 refills | Status: DC
  Filled 2023-03-31: qty 90, 90d supply, fill #0
  Filled 2023-07-15: qty 90, 90d supply, fill #1
  Filled 2023-11-05 (×2): qty 90, 90d supply, fill #2
  Filled 2023-12-09: qty 90, 90d supply, fill #3

## 2023-04-01 ENCOUNTER — Other Ambulatory Visit (HOSPITAL_BASED_OUTPATIENT_CLINIC_OR_DEPARTMENT_OTHER): Payer: Self-pay

## 2023-04-01 MED ORDER — COMIRNATY 30 MCG/0.3ML IM SUSY
0.3000 mL | PREFILLED_SYRINGE | Freq: Once | INTRAMUSCULAR | 0 refills | Status: AC
  Filled 2023-04-01: qty 0.3, 1d supply, fill #0

## 2023-04-01 NOTE — Patient Instructions (Addendum)
It was a pleasure to see you today. Flu vaccine given. Labs are stable. Continue same meds and follow up in 6 months.

## 2023-04-06 ENCOUNTER — Other Ambulatory Visit: Payer: Self-pay

## 2023-04-06 MED ORDER — VITAMIN D (ERGOCALCIFEROL) 1.25 MG (50000 UNIT) PO CAPS: 50000.0000 [IU] | ORAL_CAPSULE | ORAL | 99 refills | Status: AC

## 2023-04-16 ENCOUNTER — Other Ambulatory Visit: Payer: Self-pay | Admitting: Internal Medicine

## 2023-04-16 DIAGNOSIS — Z1231 Encounter for screening mammogram for malignant neoplasm of breast: Secondary | ICD-10-CM

## 2023-05-08 ENCOUNTER — Other Ambulatory Visit (HOSPITAL_BASED_OUTPATIENT_CLINIC_OR_DEPARTMENT_OTHER): Payer: Self-pay

## 2023-05-19 ENCOUNTER — Ambulatory Visit
Admission: RE | Admit: 2023-05-19 | Discharge: 2023-05-19 | Disposition: A | Discharge status: Home / Self Care | Payer: Medicare HMO | Source: Ambulatory Visit | Attending: Internal Medicine | Admitting: Internal Medicine

## 2023-05-19 DIAGNOSIS — Z1231 Encounter for screening mammogram for malignant neoplasm of breast: Secondary | ICD-10-CM

## 2023-08-20 IMAGING — MG MM DIGITAL SCREENING BILAT W/ TOMO AND CAD
8 series · 8 of 24 positions shown · non-contrast
Comparison: Previous exam(s).

CLINICAL DATA: Screening.

EXAM:
DIGITAL SCREENING BILATERAL MAMMOGRAM WITH TOMOSYNTHESIS AND CAD
TECHNIQUE: Bilateral screening digital craniocaudal and mediolateral oblique
mammograms were obtained. Bilateral screening digital breast
tomosynthesis was performed. The images were evaluated with
computer-aided detection.

[R MLO synth-2D]
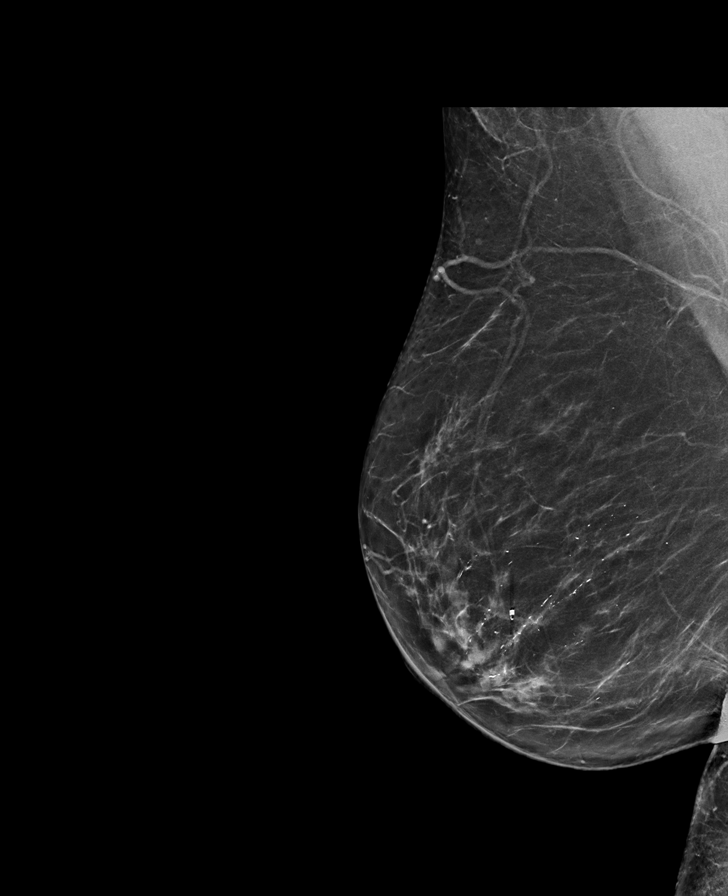

[R CC synth-2D]
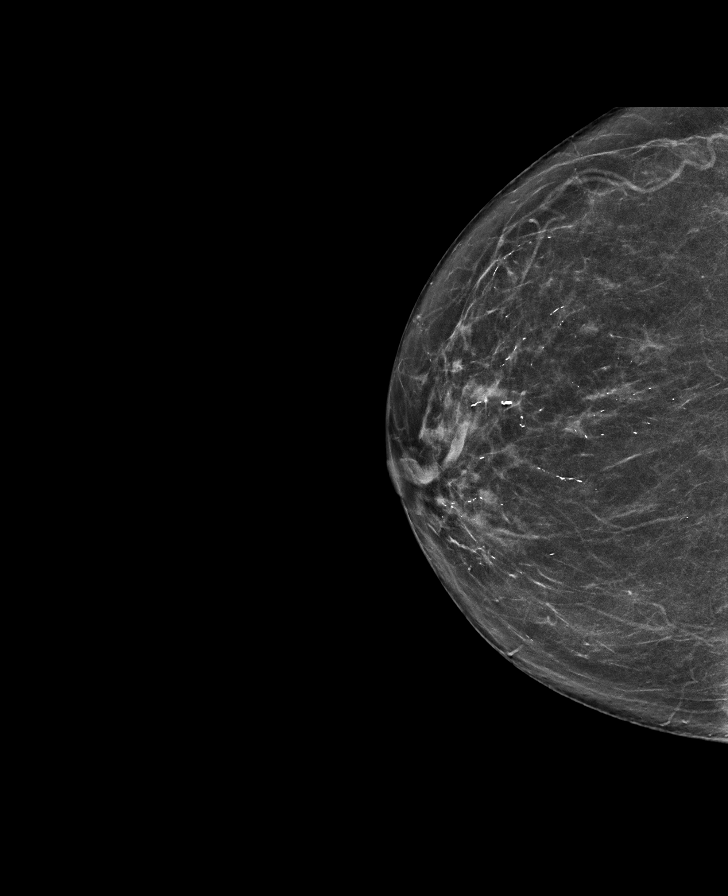

[L MLO synth-2D]
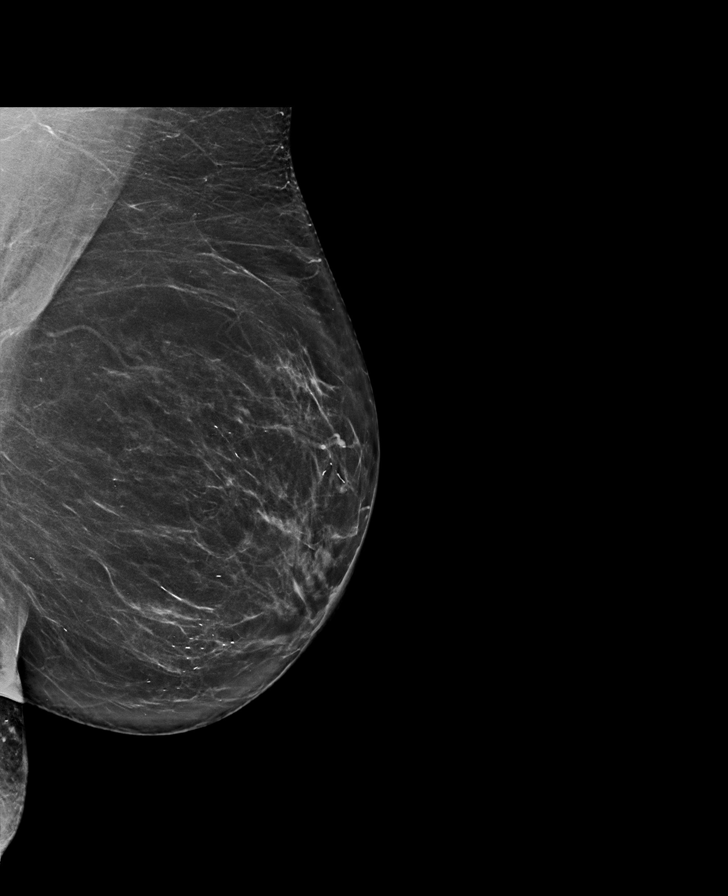

[L CC synth-2D]
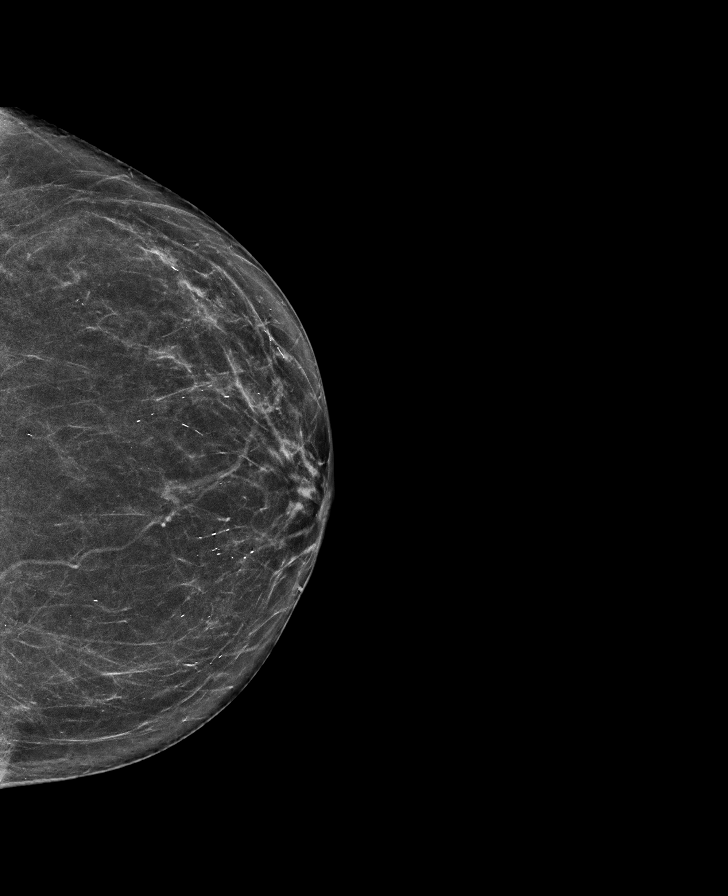

[R CC tomo · tomo slice 35/68.0]
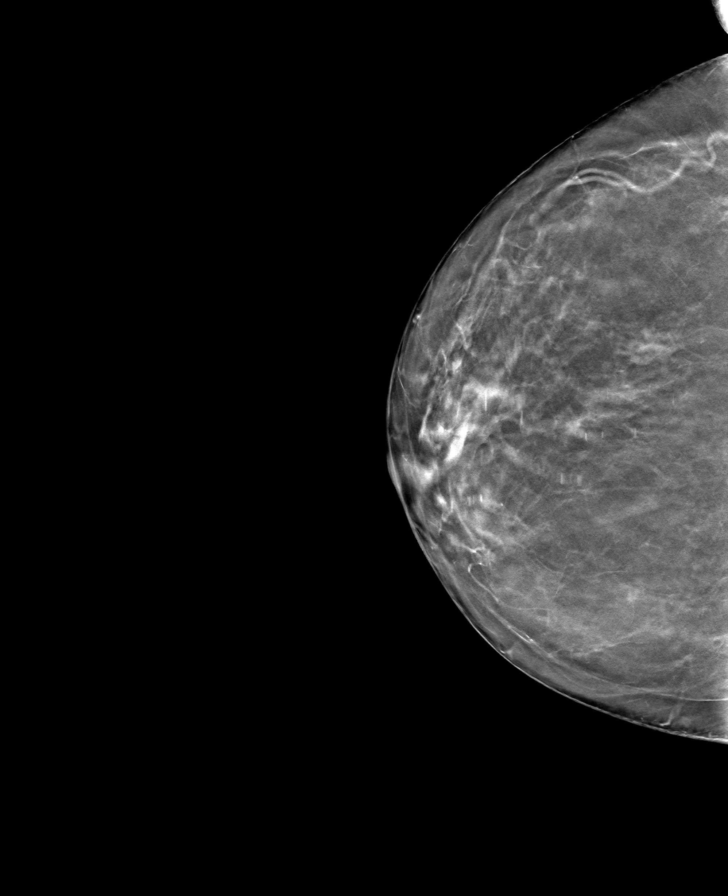

[L CC tomo · tomo slice 35/68.0]
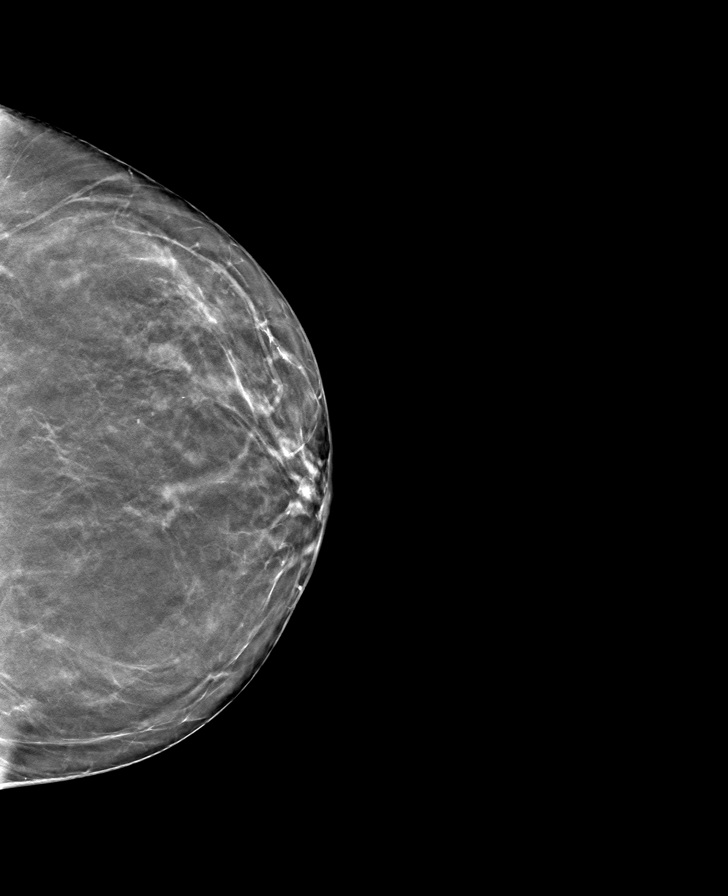

[L MLO tomo · tomo slice 40/79.0]
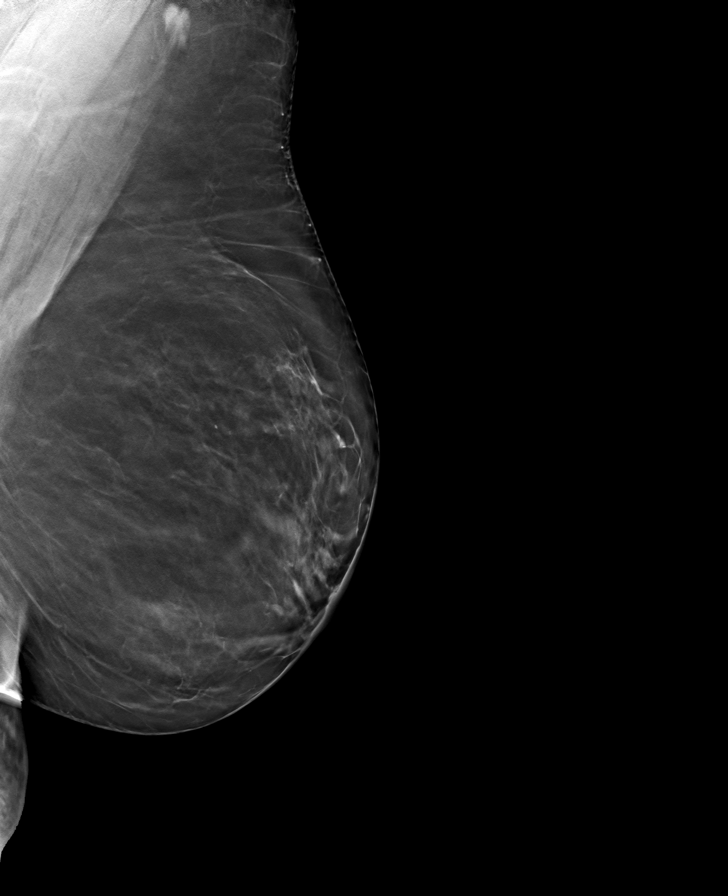

[R MLO tomo · tomo slice 39/78.0]
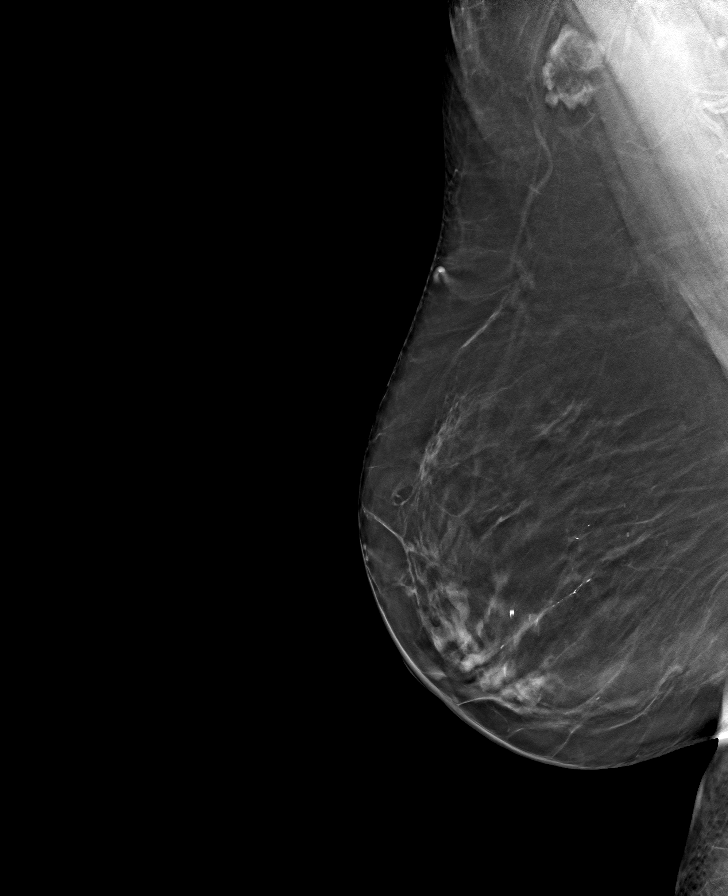

[8 of 24 positions shown; findings below may reference images not displayed]

ACR Breast Density Category b: There are scattered areas of
fibroglandular density.
FINDINGS: There are no findings suspicious for malignancy.
IMPRESSION: No mammographic evidence of malignancy. A result letter of this
screening mammogram will be mailed directly to the patient.

RECOMMENDATION:
Screening mammogram in one year. (Code:51-O-LD2)

BI-RADS CATEGORY  1: Negative.

## 2023-08-31 ENCOUNTER — Other Ambulatory Visit: Payer: Self-pay

## 2023-08-31 MED ORDER — ATORVASTATIN CALCIUM 80 MG PO TABS: 80.0000 mg | ORAL_TABLET | Freq: Every day | ORAL | 1 refills | Status: AC

## 2023-08-31 MED ORDER — LOSARTAN POTASSIUM-HCTZ 100-25 MG PO TABS: 1.0000 | ORAL_TABLET | Freq: Every day | ORAL | 1 refills | Status: AC

## 2023-08-31 MED ORDER — AMLODIPINE BESYLATE 5 MG PO TABS: 5.0000 mg | ORAL_TABLET | Freq: Every morning | ORAL | 1 refills | Status: AC

## 2023-08-31 NOTE — Addendum Note (Signed)
 Addended by: Huda Petrey P on: 08/31/2023 03:56 PM   Modules accepted: Orders

## 2023-09-21 ENCOUNTER — Encounter: Payer: Self-pay | Admitting: Internal Medicine

## 2023-09-21 ENCOUNTER — Ambulatory Visit (INDEPENDENT_AMBULATORY_CARE_PROVIDER_SITE_OTHER): Admitting: Internal Medicine

## 2023-09-21 VITALS — BP 120/70 | HR 82 | Ht 61.75 in | Wt 170.0 lb

## 2023-09-21 DIAGNOSIS — F419 Anxiety disorder, unspecified: Secondary | ICD-10-CM | POA: Diagnosis not present

## 2023-09-21 DIAGNOSIS — Z7984 Long term (current) use of oral hypoglycemic drugs: Secondary | ICD-10-CM | POA: Diagnosis not present

## 2023-09-21 DIAGNOSIS — G4700 Insomnia, unspecified: Secondary | ICD-10-CM

## 2023-09-21 DIAGNOSIS — M5432 Sciatica, left side: Secondary | ICD-10-CM

## 2023-09-21 DIAGNOSIS — M79605 Pain in left leg: Secondary | ICD-10-CM

## 2023-09-21 DIAGNOSIS — E119 Type 2 diabetes mellitus without complications: Secondary | ICD-10-CM

## 2023-09-21 MED ORDER — TRAMADOL HCL 50 MG PO TABS: 50.0000 mg | ORAL_TABLET | Freq: Three times a day (TID) | ORAL | 0 refills | Status: AC | PRN

## 2023-09-21 MED ORDER — ALPRAZOLAM 0.5 MG PO TABS: 0.5000 mg | ORAL_TABLET | Freq: Two times a day (BID) | ORAL | 0 refills | Status: AC | PRN

## 2023-09-21 MED ORDER — METHYLPREDNISOLONE 4 MG PO TABS: ORAL_TABLET | ORAL | 1 refills | Status: DC

## 2023-09-21 NOTE — Progress Notes (Signed)
 Patient Care Team: Sylvan Evener, MD as PCP - General (Internal Medicine)  Visit Date: 09/21/23  Subjective:   Chief Complaint  Patient presents with   Leg Pain    Left leg when sitting down, going on since April.   Patient TK:ZSWFUX [name],Female DOB:[birth year],73 y.o. NAT:[id number]   73 y.o.Female presents today for acute visit with Left Leg Pain. Patient has a past medical history of Bilateral Carpal Tunnel Syndrome, Hyperlipidemia, and Hypertension. Says that recently went on a trip to [place] and while on the plane to and from she began experiencing pain in her left leg through the whole flight and then again while sitting in the theater. She says that she remembers having a similar pain in her right leg several years ago. Says that she did have an instance of this pain in April, but was having regular massages until her recent trip.   Past Medical History:  Diagnosis Date   Bilateral carpal tunnel syndrome    Hyperlipidemia    Hypertension   No Known Allergies  Family History  Problem Relation Age of Onset   Heart disease Mother    Stroke Mother    Cancer Father    Hyperlipidemia Sister    Hypertension Sister    Colon cancer Neg Hx    Esophageal cancer Neg Hx    Stomach cancer Neg Hx    Rectal cancer Neg Hx    Social Hx: Married and has dogs.  Has  adult children. Enjoys traveling. Exercises regularly and stays in good physical shape. Lives with husband who is a retired Warden/ranger.  Review of Systems  Musculoskeletal:        (+) Left Leg Pain     Objective:  Vitals:  Today's Vitals   09/21/23 1526  BP: 120/70  Pulse: 82  SpO2: 96%  Weight: 170 lb (77.1 kg)  Height: 5' 1.75" (1.568 m)  PainSc: 9   PainLoc: Leg   Physical Exam Vitals and nursing note reviewed.  Constitutional:      General: She is not in acute distress.    Appearance: Normal appearance. She is not toxic-appearing.  HENT:     Head: Normocephalic and atraumatic.  Pulmonary:      Effort: Pulmonary effort is normal.  Musculoskeletal:     Lumbar back: Negative right straight leg raise test and negative left straight leg raise test.  Skin:    General: Skin is warm and dry.  Neurological:     Mental Status: She is alert and oriented to person, place, and time. Mental status is at baseline.     Motor: Motor function is intact. No weakness.  Psychiatric:        Mood and Affect: Mood normal.        Behavior: Behavior normal.        Thought Content: Thought content normal.        Judgment: Judgment normal.     Results:  Studies Obtained And Personally Reviewed By Me: Labs:     Component Value Date/Time   NA 140 10/02/2022 0952   K 4.1 10/02/2022 0952   CL 106 10/02/2022 0952   CO2 27 10/02/2022 0952   GLUCOSE 118 (H) 10/02/2022 0952   BUN 23 10/02/2022 0952   CREATININE 0.63 10/02/2022 0952   CALCIUM  9.5 10/02/2022 0952   PROT 6.9 03/24/2023 0951   ALBUMIN 4.0 10/21/2016 0905   AST 20 03/24/2023 0951   ALT 23 03/24/2023 0951   ALKPHOS 59 10/21/2016  4098   BILITOT 1.0 03/24/2023 0951   GFRNONAA 85 07/17/2020 0923   GFRAA 98 07/17/2020 0923    Lab Results  Component Value Date   WBC 4.6 10/02/2022   HGB 13.4 10/02/2022   HCT 40.0 10/02/2022   MCV 93.2 10/02/2022   PLT 304 10/02/2022   Lab Results  Component Value Date   CHOL 179 03/24/2023   HDL 61 03/24/2023   LDLCALC 95 03/24/2023   TRIG 131 03/24/2023   CHOLHDL 2.9 03/24/2023   Lab Results  Component Value Date   HGBA1C 6.2 (H) 03/24/2023    Lab Results  Component Value Date   TSH 1.12 10/02/2022    Assessment & Plan:   Meds ordered this encounter  Medications   methylPREDNISolone (MEDROL) 4 MG tablet    Sig: Take in tapering course as directed 6-5-4-3-2-1    Dispense:  21 tablet    Refill:  1   ALPRAZolam  (XANAX ) 0.5 MG tablet    Sig: Take 1 tablet (0.5 mg total) by mouth 2 (two) times daily as needed for anxiety.    Dispense:  60 tablet    Refill:  0   traMADol  (ULTRAM ) 50  MG tablet    Sig: Take 1 tablet (50 mg total) by mouth every 8 (eight) hours as needed for up to 5 days.    Dispense:  15 tablet    Refill:  0   Left Leg Pain:  Suspect left sciatica--ongoing since April, but has possibly been managed with regular massages, until her recent trip where she'd been on the plane for several hours from [place] to [place] and then finally [place], on the return flight, and most recently while sitting through a theater performance. She apparently had a similar pain in her right leg several years ago, possibly in March of 2020, but there are no notes within [facility] detailing this extensively. Negative straight leg raise test left & right, strength normal, no motor deficits on exam. Sending in 4 mg Medrol tapering course 6-5-4-3-2-1, followed by Tramadol  #15 tabs  (50 mg) to take every 8 hours as needed up to 5 days.  Type 2 Diabetes stable and treated with Metformin  500 mg daily and Glipizide  5 mg daily. 03/24/2023 A1c 6.2. Instructed to monitor glucose while taking Medrol.  Anxiety/Insomnia treated with Xanax  0.5 mg twice daily as needed. Refilled today per patient request.     I,Emily Lagle,acting as a scribe for Sylvan Evener, MD.,have documented all relevant documentation on the behalf of Sylvan Evener, MD,as directed by  Sylvan Evener, MD while in the presence of Sylvan Evener, MD.   I, Sylvan Evener, MD, have reviewed all documentation for this visit. The documentation on 09/22/23 for the exam, diagnosis, procedures, and orders are all accurate and complete.

## 2023-09-22 ENCOUNTER — Ambulatory Visit: Payer: Self-pay

## 2023-09-22 NOTE — Patient Instructions (Addendum)
 You have been diagnosed with sciatica. A prescription has been faxed to [facility]. Please call them for an appointment. Please take a 6 day taper of medrol 4 mg and take as needed Tramadol  for pain every 8 hours. Continue diabetic meds and monitor glucose. May take Xanax  as needed for anxiety with travel. If not improving after 2-3 weeks of PT can consider MRI.

## 2023-09-22 NOTE — Telephone Encounter (Signed)
 This RN left voicemail to attempt to answer questions. Will forward to office for follow up.

## 2023-09-22 NOTE — Telephone Encounter (Signed)
 Copied from CRM [id number]. Topic: Clinical - Medication Question >> Sep 22, 2023  4:32 PM Tiffany S wrote: Reason for CRM: methylPREDNISolone (MEDROL) 4 MG tablet [[id number]]  Patient has questions regarding this medication please follow up with patient

## 2023-09-23 DIAGNOSIS — M5442 Lumbago with sciatica, left side: Secondary | ICD-10-CM | POA: Diagnosis not present

## 2023-09-24 NOTE — Progress Notes (Signed)
 Patient Care Team: Perri Ronal PARAS, MD as PCP - General (Internal Medicine)  Visit Date: 09/24/23  Subjective:  Patient PI:Gwendolyn Herman, Gwendolyn Herman DOB:Nov 06, 1950,73 y.o. FMW:[id number]   73 y.o.Female presents today for 5 day follow-up for Left-sided Sciatica. Seen for this on 6/09, she was prescribed Medrol  tapering course and Tramadol  50 mg to take every 8 hours as needed up to 5 days, and was referred to [facility] for physical therapy, which she has reportedly started.       Past Medical History:  Diagnosis Date   Bilateral carpal tunnel syndrome    Hyperlipidemia    Hypertension   No Known Allergies  Family History  Problem Relation Age of Onset   Heart disease Mother    Stroke Mother    Cancer Father    Hyperlipidemia Sister    Hypertension Sister    Colon cancer Neg Hx    Esophageal cancer Neg Hx    Stomach cancer Neg Hx    Rectal cancer Neg Hx    Social Hx: Married with adult children. Nonsmoker. Social alcohol consumption.  ROS  Continues to experience discomfort/pain in left buttock and posterior left upper thigh. Leaving soon for [place]. Objective:  Vitals: reviewed  Physical Exam  Stright leg raising on left reveals persistent disconfort Results:  Studies Obtained And Personally Reviewed By Me:    Labs:     Component Value Date/Time   NA 140 10/02/2022 0952   K 4.1 10/02/2022 0952   CL 106 10/02/2022 0952   CO2 27 10/02/2022 0952   GLUCOSE 118 (H) 10/02/2022 0952   BUN 23 10/02/2022 0952   CREATININE 0.63 10/02/2022 0952   CALCIUM  9.5 10/02/2022 0952   PROT 6.9 03/24/2023 0951   ALBUMIN 4.0 10/21/2016 0905   AST 20 03/24/2023 0951   ALT 23 03/24/2023 0951   ALKPHOS 59 10/21/2016 0905   BILITOT 1.0 03/24/2023 0951   GFRNONAA 85 07/17/2020 0923   GFRAA 98 07/17/2020 0923    Lab Results  Component Value Date   WBC 4.6 10/02/2022   HGB 13.4 10/02/2022   HCT 40.0 10/02/2022   MCV 93.2 10/02/2022   PLT 304 10/02/2022   Lab Results   Component Value Date   CHOL 179 03/24/2023   HDL 61 03/24/2023   LDLCALC 95 03/24/2023   TRIG 131 03/24/2023   CHOLHDL 2.9 03/24/2023   Lab Results  Component Value Date   HGBA1C 6.2 (H) 03/24/2023    Lab Results  Component Value Date   TSH 1.12 10/02/2022      Assessment & Plan:   Persistent left sciatica and is going to [place] soon. PT did help some. Asking for plan regarding meds to take while on trip.  May take Neurontin  300 mg twice daily, Meloxicam  15 mg daily, Has Medrol  dose pack on hand if she gets into trouble and has worsening of sciatica. Careful with alcohol consumption. Has Xanax  for anxiety and sleep.Has Tramadol  for pain. Not sure she will be able to take extended bike rides in [place].  40 minutes spent with patient with in depth discussion about how to manage pain and physical activities on trip to [place].    I,Gwendolyn Herman,acting as a Neurosurgeon for Ronal PARAS Perri, MD.,have documented all relevant documentation on the behalf of Ronal PARAS Perri, MD,as directed by  Ronal PARAS Perri, MD while in the presence of Ronal PARAS Perri, MD.   I, Ronal PARAS Perri, MD, have reviewed all documentation for this visit. The  documentation on 10/11/23 for the exam, diagnosis, procedures, and orders are all accurate and complete.

## 2023-09-25 ENCOUNTER — Ambulatory Visit (INDEPENDENT_AMBULATORY_CARE_PROVIDER_SITE_OTHER): Admitting: Internal Medicine

## 2023-09-25 VITALS — BP 130/86 | HR 70 | Temp 98.7°F | Ht 62.5 in | Wt 167.4 lb

## 2023-09-25 DIAGNOSIS — M5432 Sciatica, left side: Secondary | ICD-10-CM | POA: Diagnosis not present

## 2023-09-25 DIAGNOSIS — F419 Anxiety disorder, unspecified: Secondary | ICD-10-CM

## 2023-09-25 MED ORDER — MELOXICAM 15 MG PO TABS: 15.0000 mg | ORAL_TABLET | Freq: Every day | ORAL | 0 refills | Status: DC

## 2023-09-25 MED ORDER — GABAPENTIN 300 MG PO CAPS: 300.0000 mg | ORAL_CAPSULE | Freq: Two times a day (BID) | ORAL | 1 refills | Status: AC

## 2023-10-06 ENCOUNTER — Other Ambulatory Visit: Payer: Medicare HMO

## 2023-10-08 ENCOUNTER — Ambulatory Visit: Payer: Medicare HMO | Admitting: Internal Medicine

## 2023-10-09 DIAGNOSIS — M5442 Lumbago with sciatica, left side: Secondary | ICD-10-CM | POA: Diagnosis not present

## 2023-10-11 ENCOUNTER — Encounter: Payer: Self-pay | Admitting: Internal Medicine

## 2023-10-11 NOTE — Patient Instructions (Addendum)
 Extensive discussion regarding pain management for upcoming trip to [place]. Careful with alcohol consumption. May nee more PT when returns from trip.

## 2023-10-14 DIAGNOSIS — M5442 Lumbago with sciatica, left side: Secondary | ICD-10-CM | POA: Diagnosis not present

## 2023-10-19 ENCOUNTER — Other Ambulatory Visit

## 2023-10-22 ENCOUNTER — Other Ambulatory Visit

## 2023-10-22 DIAGNOSIS — E1169 Type 2 diabetes mellitus with other specified complication: Secondary | ICD-10-CM | POA: Diagnosis not present

## 2023-10-22 DIAGNOSIS — E119 Type 2 diabetes mellitus without complications: Secondary | ICD-10-CM

## 2023-10-22 DIAGNOSIS — M8580 Other specified disorders of bone density and structure, unspecified site: Secondary | ICD-10-CM | POA: Diagnosis not present

## 2023-10-22 DIAGNOSIS — M85851 Other specified disorders of bone density and structure, right thigh: Secondary | ICD-10-CM

## 2023-10-22 DIAGNOSIS — I10 Essential (primary) hypertension: Secondary | ICD-10-CM | POA: Diagnosis not present

## 2023-10-22 DIAGNOSIS — E782 Mixed hyperlipidemia: Secondary | ICD-10-CM | POA: Diagnosis not present

## 2023-10-22 DIAGNOSIS — Z0000 Encounter for general adult medical examination without abnormal findings: Secondary | ICD-10-CM | POA: Diagnosis not present

## 2023-10-23 ENCOUNTER — Ambulatory Visit: Payer: Self-pay | Admitting: Internal Medicine

## 2023-10-23 LAB — CBC WITH DIFFERENTIAL/PLATELET
Absolute Lymphocytes: 1768 {cells}/uL (ref 850–3900)
Absolute Monocytes: 344 {cells}/uL (ref 200–950)
Basophils Absolute: 40 {cells}/uL (ref 0–200)
Basophils Relative: 1 %
Eosinophils Absolute: 160 {cells}/uL (ref 15–500)
Eosinophils Relative: 4 %
HCT: 40.9 % (ref 35.0–45.0)
Hemoglobin: 13.1 g/dL (ref 11.7–15.5)
MCH: 30.9 pg (ref 27.0–33.0)
MCHC: 32 g/dL (ref 32.0–36.0)
MCV: 96.5 fL (ref 80.0–100.0)
MPV: 10.2 fL (ref 7.5–12.5)
Monocytes Relative: 8.6 %
Neutro Abs: 1688 {cells}/uL (ref 1500–7800)
Neutrophils Relative %: 42.2 %
Platelets: 310 Thousand/uL (ref 140–400)
RBC: 4.24 Million/uL (ref 3.80–5.10)
RDW: 12.3 % (ref 11.0–15.0)
Total Lymphocyte: 44.2 %
WBC: 4 Thousand/uL (ref 3.8–10.8)

## 2023-10-23 LAB — COMPLETE METABOLIC PANEL WITHOUT GFR
AG Ratio: 1.8 (calc) (ref 1.0–2.5)
ALT: 22 U/L (ref 6–29)
AST: 19 U/L (ref 10–35)
Albumin: 4.2 g/dL (ref 3.6–5.1)
Alkaline phosphatase (APISO): 61 U/L (ref 37–153)
BUN: 17 mg/dL (ref 7–25)
CO2: 26 mmol/L (ref 20–32)
Calcium: 9.3 mg/dL (ref 8.6–10.4)
Chloride: 104 mmol/L (ref 98–110)
Creat: 0.63 mg/dL (ref 0.60–1.00)
Globulin: 2.4 g/dL (ref 1.9–3.7)
Glucose, Bld: 116 mg/dL — ABNORMAL HIGH (ref 65–99)
Potassium: 4.5 mmol/L (ref 3.5–5.3)
Sodium: 139 mmol/L (ref 135–146)
Total Bilirubin: 0.7 mg/dL (ref 0.2–1.2)
Total Protein: 6.6 g/dL (ref 6.1–8.1)

## 2023-10-23 LAB — MICROALBUMIN / CREATININE URINE RATIO
Creatinine, Urine: 118 mg/dL (ref 20–275)
Microalb Creat Ratio: 6 mg/g{creat} (ref <30)
Microalb, Ur: 0.7 mg/dL

## 2023-10-23 LAB — LIPID PANEL
Cholesterol: 173 mg/dL (ref <200)
HDL: 67 mg/dL (ref >=50)
LDL Cholesterol (Calc): 87 mg/dL
Non-HDL Cholesterol (Calc): 106 mg/dL (ref <130)
Total CHOL/HDL Ratio: 2.6 (calc) (ref <5.0)
Triglycerides: 94 mg/dL (ref <150)

## 2023-10-23 LAB — TSH: TSH: 0.82 m[IU]/L (ref 0.40–4.50)

## 2023-10-23 LAB — HEMOGLOBIN A1C
Hgb A1c MFr Bld: 6.2 % — ABNORMAL HIGH (ref <5.7)
Mean Plasma Glucose: 131 mg/dL
eAG (mmol/L): 7.3 mmol/L

## 2023-10-26 ENCOUNTER — Ambulatory Visit: Admitting: Internal Medicine

## 2023-10-26 VITALS — BP 100/70 | HR 86 | Temp 98.6°F | Ht 62.0 in | Wt 167.4 lb

## 2023-10-26 DIAGNOSIS — I10 Essential (primary) hypertension: Secondary | ICD-10-CM

## 2023-10-26 DIAGNOSIS — E119 Type 2 diabetes mellitus without complications: Secondary | ICD-10-CM

## 2023-10-26 DIAGNOSIS — Z0000 Encounter for general adult medical examination without abnormal findings: Secondary | ICD-10-CM | POA: Diagnosis not present

## 2023-10-26 DIAGNOSIS — E559 Vitamin D deficiency, unspecified: Secondary | ICD-10-CM

## 2023-10-26 DIAGNOSIS — M5432 Sciatica, left side: Secondary | ICD-10-CM

## 2023-10-26 DIAGNOSIS — Z1211 Encounter for screening for malignant neoplasm of colon: Secondary | ICD-10-CM | POA: Diagnosis not present

## 2023-10-26 DIAGNOSIS — E785 Hyperlipidemia, unspecified: Secondary | ICD-10-CM | POA: Diagnosis not present

## 2023-10-26 DIAGNOSIS — E1169 Type 2 diabetes mellitus with other specified complication: Secondary | ICD-10-CM

## 2023-10-26 DIAGNOSIS — M8580 Other specified disorders of bone density and structure, unspecified site: Secondary | ICD-10-CM

## 2023-10-26 DIAGNOSIS — F419 Anxiety disorder, unspecified: Secondary | ICD-10-CM

## 2023-10-26 DIAGNOSIS — Z6830 Body mass index (BMI) 30.0-30.9, adult: Secondary | ICD-10-CM

## 2023-10-26 LAB — POCT URINALYSIS DIP (CLINITEK)
Bilirubin, UA: NEGATIVE
Blood, UA: NEGATIVE
Glucose, UA: NEGATIVE mg/dL
Ketones, POC UA: NEGATIVE mg/dL
Leukocytes, UA: NEGATIVE
Nitrite, UA: NEGATIVE
POC PROTEIN,UA: NEGATIVE
Spec Grav, UA: 1.02 (ref 1.010–1.025)
Urobilinogen, UA: 0.2 U/dL
pH, UA: 6 (ref 5.0–8.0)

## 2023-10-26 NOTE — Progress Notes (Addendum)
 Annual Wellness Visit   Patient Care Team: Perri Ronal PARAS, MD as PCP - General (Internal Medicine)  Visit Date: 10/27/23   Chief Complaint  Patient presents with   Annual Exam        Medicare wellness visit Subjective:  Patient: Gwendolyn Herman, Female DOB: [birth year], 73 y.o. MRN: [id number]  Gwendolyn Herman is a 73 y.o. Female who presents today for health maintenance exam and evaluation of medical issues. Patient has Carpal Tunnel Syndrome, Right; Hypertension; Hyperlipidemia; Obesity; Insomnia; Impaired Glucose Tolerance; Metabolic Syndrome; and De Quervain's Tenosynovitis, Left.  Seen for Left-sided Sciatica in June, was prescribed Meloxicam  15 mg daily, which she is no longer taking, and Gabapentin  300 mg twice daily. Say she has been seen at PT, and has also been participating in water class, yoga, and pilates, which has improved her pain.   History of Hypertension treated with Amlodipine  5 mg daily and Losartan -HCTZ 100-25 mg daily. Blood Pressure: normotensive today at 100/70.   History of Hyperlipidemia treated with Atorvastatin  80 mg daily. 10/22/2023 Lipid Panel: WNL.  History of Diabetes Mellitus, type II treated with Glipizide  5 mg daily and Metformin  500 mg daily - discussed increasing Metformin  to twice daily. 10/22/2023 HgbA1c 6.2, elevated from 6.1; Glucose 116, elevated from 118. Her weight has remained stable since being seen in June at 167 lbs 6.4 ozBMI 30.62, had increased 10 lbs from 157 lbs 29.04 in June 2024. Due for eye exam.  History of Vitamin-D Deficiency treated with Vitamin-D 50,000 units weekly.  History of Anxiety, for which she has been prescribed Xanax  0.5 mg to take twice daily as needed, but is not currently taking.   Labs 10/22/2023 CBC: WNL CMP: WNL except for Glucose  TSH: 0.82  No hx of Abnormal PAP.  Mammogram 05/19/2023 normal with repeat recommendation of 2024/12/09.  Overdue for Colonoscopy, last completed 03/23/2013 normal - discussed and  postponed.   Bone Density 10/18/2021  T-score -1.4, osteopenic. History of Osteopenia treated with Boniva  150 mg monthly.   Vaccine Counseling: Due for Covid-19 - discussed and postponed; UTD on Flu, Shingles 2/2, PNA, and Tdap. Past Medical History:  Diagnosis Date   Bilateral carpal tunnel syndrome    Hyperlipidemia    Hypertension    Medical/Surgical History Narrative:   Allergic/Intolerant to: No Known Allergies  2020 - Hx of De Quervain's Tenosynovitis, Left Arm from lifting a dog and in June was seen by Dr. Ophelia Haddock.  12/10/2003 - Hx of Systolic Murmur with 2D echo normal  1972 - Fractured Right Foot Past Surgical History:  Procedure Laterality Date   BREAST BIOPSY Right    Other - Hx of: Bilateral Carpal Tunnel Syndrome Family History  Problem Relation Age of Onset   Heart disease Mother    Stroke Mother    Cancer Father    Hyperlipidemia Sister    Hypertension Sister    Colon cancer Neg Hx    Esophageal cancer Neg Hx    Stomach cancer Neg Hx    Rectal cancer Neg Hx    Family History Narrative: No Family History of GI Cancers Father, deceased in 2001-12-09 at age 34 while sleeping, w/ hx of Hypertension and Cancer (unknown) Mother, deceased 21 due to possible CVA w/ hx of Congestive Heart Failure and Hypertension Sister w/ hx of Hypertension, Hyperlipidemia, and Coronary Stent Placement Social History   Social History Narrative   Married - husband is a Warden/ranger. Has adult children, and has grandchildren. Non-smoker, social alcohol consumption  mainly beer. For exercise she is doing a water class, yoga, and pilates.   Review of Systems  Constitutional:  Negative for chills, fever, malaise/fatigue and weight loss.  HENT:  Negative for hearing loss, sinus pain and sore throat.   Respiratory:  Negative for cough, hemoptysis and shortness of breath.   Cardiovascular:  Negative for chest pain, palpitations, leg swelling and PND.  Gastrointestinal:  Negative for abdominal  pain, constipation, diarrhea, heartburn, nausea and vomiting.  Genitourinary:  Negative for dysuria, frequency and urgency.  Musculoskeletal:  Negative for back pain, myalgias and neck pain.  Skin:  Negative for itching and rash.  Neurological:  Negative for dizziness, tingling, seizures and headaches.  Endo/Heme/Allergies:  Negative for polydipsia.  Psychiatric/Behavioral:  Negative for depression. The patient is not nervous/anxious.     Objective:  Vitals: BP 100/70   Pulse 86   Temp 98.6 F (37 C) (Axillary)   Ht 5' 2 (1.575 m)   Wt 167 lb 6.4 oz (75.9 kg)   SpO2 95%   BMI 30.62 kg/m  Physical Exam Vitals and nursing note reviewed.  Constitutional:      General: She is not in acute distress.    Appearance: Normal appearance. She is not ill-appearing or toxic-appearing.  HENT:     Head: Normocephalic and atraumatic.     Right Ear: Hearing, tympanic membrane, ear canal and external ear normal.     Left Ear: Hearing, tympanic membrane, ear canal and external ear normal.     Mouth/Throat:     Pharynx: Oropharynx is clear.  Eyes:     Extraocular Movements: Extraocular movements intact.     Pupils: Pupils are equal, round, and reactive to light.  Neck:     Thyroid : No thyroid  mass, thyromegaly or thyroid  tenderness.     Vascular: No carotid bruit.  Cardiovascular:     Rate and Rhythm: Normal rate and regular rhythm. No extrasystoles are present.    Pulses:          Dorsalis pedis pulses are 2+ on the right side and 2+ on the left side.     Heart sounds: Normal heart sounds. No murmur heard.    No friction rub. No gallop.  Pulmonary:     Effort: Pulmonary effort is normal.     Breath sounds: Normal breath sounds. No decreased breath sounds, wheezing, rhonchi or rales.  Chest:     Chest wall: No mass.  Abdominal:     Palpations: Abdomen is soft. There is no hepatomegaly, splenomegaly or mass.     Tenderness: There is no abdominal tenderness.     Hernia: No hernia is  present.  Musculoskeletal:     Cervical back: Normal range of motion.     Right lower leg: No edema.     Left lower leg: No edema.  Lymphadenopathy:     Cervical: No cervical adenopathy.     Upper Body:     Right upper body: No supraclavicular adenopathy.     Left upper body: No supraclavicular adenopathy.  Skin:    General: Skin is warm and dry.  Neurological:     General: No focal deficit present.     Mental Status: She is alert and oriented to person, place, and time. Mental status is at baseline.     Sensory: Sensation is intact.     Motor: Motor function is intact. No weakness.     Deep Tendon Reflexes: Reflexes are normal and symmetric.  Psychiatric:  Attention and Perception: Attention normal.        Mood and Affect: Mood normal.        Speech: Speech normal.        Behavior: Behavior normal.        Thought Content: Thought content normal.        Cognition and Memory: Cognition normal.        Judgment: Judgment normal.    Most Recent Functional Status Assessment:    10/26/2023    2:14 PM  In your present state of health, do you have any difficulty performing the following activities:  Hearing? 0  Vision? 1  Difficulty concentrating or making decisions? 0  Walking or climbing stairs? 0  Dressing or bathing? 0  Doing errands, shopping? 0  Preparing Food and eating ? N  Using the Toilet? N  In the past six months, have you accidently leaked urine? N  Do you have problems with loss of bowel control? N  Managing your Medications? N  Managing your Finances? N  Housekeeping or managing your Housekeeping? N   Most Recent Fall Risk Assessment:    10/26/2023    2:18 PM  Fall Risk   Falls in the past year? 0  Number falls in past yr: 0  Injury with Fall? 0  Risk for fall due to : No Fall Risks  Follow up Falls evaluation completed   Most Recent Depression Screenings:    10/26/2023    2:19 PM 10/03/2022   11:09 AM  PHQ 2/9 Scores  PHQ - 2 Score 0 0    Most Recent Cognitive Screening:    10/26/2023    2:20 PM  6CIT Screen  What Year? 0 points  What month? 0 points  What time? 0 points  Count back from 20 0 points  Months in reverse 0 points  Repeat phrase 0 points  Total Score 0 points   Results:  Studies Obtained And Personally Reviewed By Me:  Mammogram 05/19/2023 normal.  Colonoscopy 03/23/2013 normal.   Bone Density 10/18/2021 T-score -1.4, osteopenic.   Labs:     Component Value Date/Time   NA 139 10/22/2023 0922   K 4.5 10/22/2023 0922   CL 104 10/22/2023 0922   CO2 26 10/22/2023 0922   GLUCOSE 116 (H) 10/22/2023 0922   BUN 17 10/22/2023 0922   CREATININE 0.63 10/22/2023 0922   CALCIUM  9.3 10/22/2023 0922   PROT 6.6 10/22/2023 0922   ALBUMIN 4.0 10/21/2016 0905   AST 19 10/22/2023 0922   ALT 22 10/22/2023 0922   ALKPHOS 59 10/21/2016 0905   BILITOT 0.7 10/22/2023 0922   GFRNONAA 85 07/17/2020 0923   GFRAA 98 07/17/2020 0923    Lab Results  Component Value Date   WBC 4.0 10/22/2023   HGB 13.1 10/22/2023   HCT 40.9 10/22/2023   MCV 96.5 10/22/2023   PLT 310 10/22/2023   Lab Results  Component Value Date   CHOL 173 10/22/2023   HDL 67 10/22/2023   LDLCALC 87 10/22/2023   TRIG 94 10/22/2023   CHOLHDL 2.6 10/22/2023   Lab Results  Component Value Date   HGBA1C 6.2 (H) 10/22/2023    Lab Results  Component Value Date   TSH 0.82 10/22/2023    Assessment & Plan:   Orders Placed This Encounter  Procedures   Cologuard   POCT URINALYSIS DIP (CLINITEK)  Other Labs Reviewed today: CBC: WNL CMP: WNL except for Glucose  TSH: 0.82  Left-sided Sciatica for which  she was seen for in June and was prescribed Meloxicam  15 mg daily - she is no longer taking - and Gabapentin  300 mg twice daily. She has been seen at PT, and has also been participating in water class, yoga, and pilates, which has improved her pain.   Hypertension treated with Amlodipine  5 mg daily and Losartan -HCTZ 100-25 mg daily. Blood  Pressure: normotensive today at 100/70.   Hyperlipidemia treated with Atorvastatin  80 mg daily. 10/22/2023 Lipid Panel: WNL.  Diabetes Mellitus, type II treated with Glipizide  5 mg daily and Metformin  500 mg daily - discussed increasing Metformin  to twice daily, which she is agreeable to trying, however will reduce if she has any side effects. 10/22/2023 HgbA1c 6.2, elevated from 6.1; Glucose 116, elevated from 118. Her weight has remained stable since being seen in June at 167 lbs 6.4 ozBMI 30.62, had increased 10 lbs from 157 lbs 29.04 in June 2024. Due for eye exam - has been contacted regarding this via MyChart.   Vitamin-D Deficiency treated with Vitamin-D 50,000 units weekly.  History of Anxiety, for which she has been prescribed Xanax  0.5 mg to take twice daily as needed, but is not currently taking.   No hx of Abnormal PAP.  Mammogram 05/19/2023 normal with repeat recommendation of 2026.  Overdue for Colonoscopy, last completed 03/23/2013 normal - discussed and postponed.   Bone Density 10/18/2021  T-score -1.4, osteopenic. History of Osteopenia treated with Boniva  150 mg monthly.   Vaccine Counseling: Due for Covid-19 - discussed and postponed; UTD on Flu, Shingles 2/2, PNA, and Tdap.  Return in about 7 weeks (around 12/15/2023) for labs, and then on 12/17/2023 for follow-up, or as needed.   Annual wellness visit done today including the all of the following: Reviewed patient's Family Medical History Reviewed and updated list of patient's medical providers Assessment of cognitive impairment was done Assessed patient's functional ability Established a written schedule for health screening services Health Risk Assessent Completed and Reviewed  Discussed health benefits of physical activity, and encouraged her to engage in regular exercise appropriate for her age and condition.    I,Emily Lagle,acting as a Neurosurgeon for Ronal JINNY Hailstone, MD.,have documented all relevant documentation on  the behalf of Ronal JINNY Hailstone, MD,as directed by  Ronal JINNY Hailstone, MD while in the presence of Ronal JINNY Hailstone, MD.   I, Ronal JINNY Hailstone, MD, have reviewed all documentation for this visit. The documentation on 11/01/23 for the exam, diagnosis, procedures, and orders are all accurate and complete.

## 2023-10-27 ENCOUNTER — Encounter: Payer: Self-pay | Admitting: Internal Medicine

## 2023-10-29 ENCOUNTER — Other Ambulatory Visit: Payer: Self-pay

## 2023-10-29 MED ORDER — IBANDRONATE SODIUM 150 MG PO TABS: 150.0000 mg | ORAL_TABLET | ORAL | 3 refills | Status: AC

## 2023-10-29 NOTE — Progress Notes (Addendum)
 Subjective:   Gwendolyn Herman is a 73 y.o. female who presents for Medicare wellness visit.  Visit Complete: In person  Patient Medicare AWV questionnaire was completed by the patient on 10/26/2023.  Cardiac Risk Factors include: advanced age (>41men, >32 women)     Objective:    Today's Vitals   10/26/23 1422  BP: 100/70  Pulse: 86  Temp: 98.6 F (37 C)  TempSrc: Axillary  SpO2: 95%  Weight: 167 lb 6.4 oz (75.9 kg)  Height: 5' 2 (1.575 m)   Body mass index is 30.62 kg/m.     10/26/2023    2:18 PM 10/03/2022   11:08 AM 08/29/2021    3:38 PM  Advanced Directives  Does Patient Have a Medical Advance Directive? Unable to assess, patient is non-responsive or altered mental status No No  Would patient like information on creating a medical advance directive?  No - Patient declined No - Patient declined    Current Medications (verified) Outpatient Encounter Medications as of 10/26/2023  Medication Sig   amLODipine  (NORVASC ) 5 MG tablet Take 1 tablet (5 mg total) by mouth every morning.   atorvastatin  (LIPITOR) 80 MG tablet Take 1 tablet (80 mg total) by mouth daily.   glipiZIDE  (GLUCOTROL  XL) 2.5 MG 24 hr tablet Take 2 tablets daily   losartan -hydrochlorothiazide (HYZAAR) 100-25 MG tablet Take 1 tablet by mouth daily.   metFORMIN  (GLUCOPHAGE ) 500 MG tablet Take 1 tablet (500 mg total) by mouth in the morning.   Vitamin D , Ergocalciferol , (DRISDOL ) 1.25 MG (50000 UNIT) CAPS capsule Take 1 capsule (50,000 Units total) by mouth once a week.   [DISCONTINUED] ibandronate  (BONIVA ) 150 MG tablet Take 1 tablet (150 mg total) by mouth every 30 (thirty) days. Take in the morning with a full glass of water, on an empty stomach, and do not take anything else by mouth or lie down for the next 30 min.   ALPRAZolam  (XANAX ) 0.5 MG tablet Take 1 tablet (0.5 mg total) by mouth 2 (two) times daily as needed for anxiety. (Patient not taking: Reported on 10/26/2023)   gabapentin  (NEURONTIN ) 300  MG capsule Take 1 capsule (300 mg total) by mouth 2 (two) times daily.   meloxicam  (MOBIC ) 15 MG tablet Take 1 tablet (15 mg total) by mouth daily. (Patient not taking: Reported on 10/26/2023)   methylPREDNISolone  (MEDROL ) 4 MG tablet Take in tapering course as directed 6-5-4-3-2-1 (Patient not taking: Reported on 10/26/2023)   No facility-administered encounter medications on file as of 10/26/2023.    Allergies (verified) Patient has no known allergies.   History: Past Medical History:  Diagnosis Date   Bilateral carpal tunnel syndrome    Hyperlipidemia    Hypertension    Past Surgical History:  Procedure Laterality Date   BREAST BIOPSY Right    Family History  Problem Relation Age of Onset   Heart disease Mother    Stroke Mother    Cancer Father    Hyperlipidemia Sister    Hypertension Sister    Colon cancer Neg Hx    Esophageal cancer Neg Hx    Stomach cancer Neg Hx    Rectal cancer Neg Hx    Social History   Socioeconomic History   Marital status: Married    Spouse name: Not on file   Number of children: Not on file   Years of education: Not on file   Highest education level: Bachelor's degree (e.g., BA, AB, BS)  Occupational History   Not on file  Tobacco Use   Smoking status: Never   Smokeless tobacco: Never  Substance and Sexual Activity   Alcohol use: Yes    Alcohol/week: 2.0 - 3.0 standard drinks of alcohol    Types: 2 - 3 Cans of beer per week   Drug use: No   Sexual activity: Not on file  Other Topics Concern   Not on file  Social History Narrative   Married - husband is a Warden/ranger. Has adult children, and has grandchildren. Non-smoker, social alcohol consumption mainly beer. For exercise she is doing a water class, yoga, and pilates.   Social Drivers of Corporate investment banker Strain: Low Risk  (08/12/2022)   Overall Financial Resource Strain (CARDIA)    Difficulty of Paying Living Expenses: Not hard at all  Food Insecurity: No Food  Insecurity (08/12/2022)   Hunger Vital Sign    Worried About Running Out of Food in the Last Year: Never true    Ran Out of Food in the Last Year: Never true  Transportation Needs: No Transportation Needs (08/12/2022)   PRAPARE - Administrator, Civil Service (Medical): No    Lack of Transportation (Non-Medical): No  Physical Activity: Sufficiently Active (10/26/2023)   Exercise Vital Sign    Days of Exercise per Week: 6 days    Minutes of Exercise per Session: 60 min  Stress: No Stress Concern Present (10/26/2023)   [employer] of Occupational Health - Occupational Stress Questionnaire    Feeling of Stress: Only a little  Social Connections: Moderately Isolated (08/12/2022)   Social Connection and Isolation Panel    Frequency of Communication with Friends and Family: Three times a week    Frequency of Social Gatherings with Friends and Family: Patient declined    Attends Religious Services: Never    Database administrator or Organizations: No    Attends Engineer, structural: Not on file    Marital Status: Married    Tobacco Counseling Counseling given: Not Answered   Clinical Intake:                        Activities of Daily Living    10/26/2023    2:14 PM  In your present state of health, do you have any difficulty performing the following activities:  Hearing? 0  Vision? 1  Difficulty concentrating or making decisions? 0  Walking or climbing stairs? 0  Dressing or bathing? 0  Doing errands, shopping? 0  Preparing Food and eating ? N  Using the Toilet? N  In the past six months, have you accidently leaked urine? N  Do you have problems with loss of bowel control? N  Managing your Medications? N  Managing your Finances? N  Housekeeping or managing your Housekeeping? N    Patient Care Team: Perri Ronal PARAS, MD as PCP - General (Internal Medicine)       Assessment:   This is a routine wellness examination for  Gwendolyn Herman.  Hearing/Vision screen No results found.   Goals Addressed   None    Depression Screen    10/26/2023    2:19 PM 10/03/2022   11:09 AM 05/29/2022    2:19 PM 02/25/2022    2:25 PM 08/29/2021    3:29 PM 07/19/2020   11:08 AM 07/14/2019   10:17 AM  PHQ 2/9 Scores  PHQ - 2 Score 0 0 0 0 0 0 0    Fall Risk  10/26/2023    2:18 PM 09/25/2023   12:06 PM 10/03/2022   11:08 AM 08/29/2022    9:36 AM 05/29/2022    2:19 PM  Fall Risk   Falls in the past year? 0 0 0 0 1  Number falls in past yr: 0 0 0 0 0  Injury with Fall? 0 0 0 0 0  Risk for fall due to : No Fall Risks No Fall Risks No Fall Risks No Fall Risks No Fall Risks  Follow up Falls evaluation completed Falls evaluation completed Falls evaluation completed Falls prevention discussed Falls prevention discussed    MEDICARE RISK AT HOME: Medicare Risk at Home Any stairs in or around the home?: Yes If so, are there any without handrails?: Yes Home free of loose throw rugs in walkways, pet beds, electrical cords, etc?: Yes Adequate lighting in your home to reduce risk of falls?: Yes Life alert?: No Use of a cane, walker or w/c?: No Grab bars in the bathroom?: No Shower chair or bench in shower?: Yes Elevated toilet seat or a handicapped toilet?: Yes  TIMED UP AND GO:  Was the test performed?  No    Cognitive Function:    10/26/2023    2:20 PM 08/29/2021    3:20 PM  MMSE - Mini Mental State Exam  Orientation to time 5 5  Orientation to Place 5 5  Registration 3 3  Attention/ Calculation 5 5  Recall 3 3  Language- name 2 objects 2 2  Language- repeat 1 1  Language- follow 3 step command 3 3  Language- read & follow direction 1 1  Write a sentence 1 1  Copy design 1 1  Total score 30 30        10/26/2023    2:20 PM 10/03/2022   11:09 AM 08/29/2021    3:36 PM  6CIT Screen  What Year? 0 points 0 points 0 points  What month? 0 points 0 points 0 points  What time? 0 points 0 points 0 points  Count back from  20 0 points 0 points 0 points  Months in reverse 0 points 2 points 0 points  Repeat phrase 0 points 0 points 0 points  Total Score 0 points 2 points 0 points    Immunizations Immunization History  Administered Date(s) Administered   Influenza, Seasonal, Injecte, Preservative Fre 03/31/2023   Influenza,inj,Quad PF,6+ Mos 01/13/2019   Influenza-Unspecified 01/11/2022   PFIZER(Purple Top)SARS-COV-2 Vaccination 05/20/2019, 06/15/2019, 12/13/2019, 07/17/2020, 01/21/2021   PNEUMOCOCCAL CONJUGATE-20 01/11/2022   Pfizer(Comirnaty )Fall Seasonal Vaccine 12 years and older 04/01/2023   Pneumococcal Conjugate-13 05/19/2017   Respiratory Syncytial Virus Vaccine ,Recomb Aduvanted(Arexvy ) 02/05/2022   Td 10/13/2022   Tdap 07/14/2003, 11/01/2013   Unspecified SARS-COV-2 Vaccination 01/11/2022   Zoster Recombinant(Shingrix) 08/28/2020, 10/25/2020   Zoster, Live 11/22/2013    TDAP status: Due, Education has been provided regarding the importance of this vaccine. Advised may receive this vaccine at local pharmacy or Health Dept. Aware to provide a copy of the vaccination record if obtained from local pharmacy or Health Dept. Verbalized acceptance and understanding.  Flu Vaccine status: Up to date  Pneumococcal vaccine status: Up to date  Covid-19 vaccine status: Information provided on how to obtain vaccines.   Qualifies for Shingles Vaccine? Yes   Zostavax completed Yes   Shingrix Completed?: Yes  Screening Tests Health Maintenance  Topic Date Due   OPHTHALMOLOGY EXAM  07/01/2023   COVID-19 Vaccine (8 - 2024-25 season) 11/11/2023 (Originally 09/30/2023)   Colonoscopy  10/25/2024 (Originally 03/24/2023)   INFLUENZA VACCINE  11/13/2023   FOOT EXAM  03/30/2024   HEMOGLOBIN A1C  04/23/2024   Diabetic kidney evaluation - eGFR measurement  10/21/2024   Diabetic kidney evaluation - Urine ACR  10/21/2024   Medicare Annual Wellness (AWV)  10/25/2024   MAMMOGRAM  05/18/2025   DTaP/Tdap/Td (4 - Td  or Tdap) 10/12/2032   Pneumococcal Vaccine: 50+ Years  Completed   DEXA SCAN  Completed   Zoster Vaccines- Shingrix  Completed   Hepatitis B Vaccines  Aged Out   HPV VACCINES  Aged Out   Meningococcal B Vaccine  Aged Out   Hepatitis C Screening  Discontinued    Health Maintenance  Health Maintenance Due  Topic Date Due   OPHTHALMOLOGY EXAM  07/01/2023    Colorectal cancer screening: Type of screening: Colonoscopy. Completed 03/23/2013. Repeat every 10 years  Mammogram status: Completed 05/19/2023. Repeat every year  Bone Density status: Completed 10/18/2021. Results reflect: Bone density results: OSTEOPENIA. Repeat every 2 years.  Lung Cancer Screening: (Low Dose CT Chest recommended if Age 34-80 years, 20 pack-year currently smoking OR have quit w/in 15years.) does not qualify.    Additional Screening:  Hepatitis C Screening: does not qualify; Completed   Vision Screening: Recommended annual ophthalmology exams for early detection of glaucoma and other disorders of the eye. Is the patient up to date with their annual eye exam?  Yes  Who is the provider or what is the name of the office in which the patient attends annual eye exams? [facility] If pt is not established with a provider, would they like to be referred to a provider to establish care? N/a.   Dental Screening: Recommended annual dental exams for proper oral hygiene   Community Resource Referral / Chronic Care Management: CRR required this visit?  No   CCM required this visit?  No     Plan:     I have personally reviewed and noted the following in the patient's chart:   Medical and social history Use of alcohol, tobacco or illicit drugs  Current medications and supplements including opioid prescriptions. Patient is not currently taking opioid prescriptions. Functional ability and status Nutritional status Physical activity Advanced directives List of other physicians Hospitalizations,  surgeries, and ER visits in previous 12 months Vitals Screenings to include cognitive, depression, and falls Referrals and appointments  In addition, I have reviewed and discussed with patient certain preventive protocols, quality metrics, and best practice recommendations. A written personalized care plan for preventive services as well as general preventive health recommendations were provided to patient.     Araceli Zelda, CMA   10/29/2023   After Visit Summary: (In Person-Printed) AVS printed and given to the patient  I, Ronal JINNY Hailstone, MD, have reviewed all documentation for this visit. The documentation on 11/01/23 for the exam, diagnosis, procedures, and orders are all accurate and complete.

## 2023-11-01 ENCOUNTER — Encounter: Payer: Self-pay | Admitting: Internal Medicine

## 2023-11-01 NOTE — Addendum Note (Signed)
 Addended by: PERRI RONAL PARAS on: 11/01/2023 04:38 PM   Modules accepted: Level of Service

## 2023-11-01 NOTE — Patient Instructions (Signed)
 Medicare wellness visit completed.

## 2023-11-05 ENCOUNTER — Other Ambulatory Visit (HOSPITAL_BASED_OUTPATIENT_CLINIC_OR_DEPARTMENT_OTHER): Payer: Self-pay

## 2023-11-05 ENCOUNTER — Other Ambulatory Visit (HOSPITAL_COMMUNITY): Payer: Self-pay

## 2023-11-05 ENCOUNTER — Other Ambulatory Visit: Payer: Self-pay

## 2023-11-06 DIAGNOSIS — M5442 Lumbago with sciatica, left side: Secondary | ICD-10-CM | POA: Diagnosis not present

## 2023-11-11 DIAGNOSIS — Z1211 Encounter for screening for malignant neoplasm of colon: Secondary | ICD-10-CM | POA: Diagnosis not present

## 2023-11-17 LAB — COLOGUARD: COLOGUARD: NEGATIVE

## 2023-11-18 ENCOUNTER — Encounter: Payer: Self-pay | Admitting: Internal Medicine

## 2023-11-18 ENCOUNTER — Ambulatory Visit: Admitting: Internal Medicine

## 2023-11-18 ENCOUNTER — Ambulatory Visit: Payer: Self-pay | Admitting: Internal Medicine

## 2023-11-18 VITALS — Ht 62.0 in | Wt 167.0 lb

## 2023-11-18 DIAGNOSIS — Z0000 Encounter for general adult medical examination without abnormal findings: Secondary | ICD-10-CM

## 2023-11-18 NOTE — Addendum Note (Signed)
 Addended by: Madisan Bice P on: 11/18/2023 12:40 PM   Modules accepted: Orders

## 2023-11-18 NOTE — Progress Notes (Deleted)
 Subjective:   Gwendolyn Herman is a 73 y.o. female who presents for Medicare Annual (Subsequent) preventive examination.  Visit Complete: In person  Patient Medicare AWV questionnaire was completed by the patient on 11/18/2023; I have confirmed that all information answered by patient is correct and no changes since this date.  Cardiac Risk Factors include: advanced age (>73men, >82 women);dyslipidemia;hypertension;obesity (BMI >30kg/m2)     Objective:    Today's Vitals   11/18/23 0931 11/18/23 0935  Weight: 167 lb (75.8 kg)   Height: 5' 2 (1.575 m)   PainSc:  0-No pain   Body mass index is 30.54 kg/m.     11/18/2023    9:32 AM 10/26/2023    2:18 PM 10/03/2022   11:08 AM 08/29/2021    3:38 PM  Advanced Directives  Does Patient Have a Medical Advance Directive? No Unable to assess, patient is non-responsive or altered mental status No No  Would patient like information on creating a medical advance directive? No - Patient declined  No - Patient declined No - Patient declined    Current Medications (verified) Outpatient Encounter Medications as of 11/18/2023  Medication Sig   ALPRAZolam  (XANAX ) 0.5 MG tablet Take 1 tablet (0.5 mg total) by mouth 2 (two) times daily as needed for anxiety.   amLODipine  (NORVASC ) 5 MG tablet Take 1 tablet (5 mg total) by mouth every morning.   atorvastatin  (LIPITOR) 80 MG tablet Take 1 tablet (80 mg total) by mouth daily.   gabapentin  (NEURONTIN ) 300 MG capsule Take 1 capsule (300 mg total) by mouth 2 (two) times daily.   glipiZIDE  (GLUCOTROL  XL) 2.5 MG 24 hr tablet Take 2 tablets daily   ibandronate  (BONIVA ) 150 MG tablet Take 1 tablet (150 mg total) by mouth every 30 (thirty) days. Take in the morning with a full glass of water, on an empty stomach, and do not take anything else by mouth or lie down for the next 30 min.   losartan -hydrochlorothiazide (HYZAAR) 100-25 MG tablet Take 1 tablet by mouth daily.   metFORMIN  (GLUCOPHAGE ) 500 MG tablet  Take 1 tablet (500 mg total) by mouth in the morning.   Vitamin D , Ergocalciferol , (DRISDOL ) 1.25 MG (50000 UNIT) CAPS capsule Take 1 capsule (50,000 Units total) by mouth once a week.   [DISCONTINUED] meloxicam  (MOBIC ) 15 MG tablet Take 1 tablet (15 mg total) by mouth daily. (Patient not taking: Reported on 10/26/2023)   [DISCONTINUED] methylPREDNISolone  (MEDROL ) 4 MG tablet Take in tapering course as directed 6-5-4-3-2-1 (Patient not taking: Reported on 10/26/2023)   No facility-administered encounter medications on file as of 11/18/2023.    Allergies (verified) Patient has no known allergies.   History: Past Medical History:  Diagnosis Date   Bilateral carpal tunnel syndrome    Hyperlipidemia    Hypertension    Past Surgical History:  Procedure Laterality Date   BREAST BIOPSY Right    Family History  Problem Relation Age of Onset   Heart disease Mother    Stroke Mother    Cancer Father    Hyperlipidemia Sister    Hypertension Sister    Colon cancer Neg Hx    Esophageal cancer Neg Hx    Stomach cancer Neg Hx    Rectal cancer Neg Hx    Social History   Socioeconomic History   Marital status: Married    Spouse name: Not on file   Number of children: Not on file   Years of education: Not on file   Highest  education level: Bachelor's degree (e.g., BA, AB, BS)  Occupational History   Not on file  Tobacco Use   Smoking status: Never   Smokeless tobacco: Never  Substance and Sexual Activity   Alcohol use: Yes    Alcohol/week: 2.0 - 3.0 standard drinks of alcohol    Types: 2 - 3 Cans of beer per week   Drug use: No   Sexual activity: Not on file  Other Topics Concern   Not on file  Social History Narrative   Married - husband is a Warden/ranger. Has adult children, and has grandchildren. Non-smoker, social alcohol consumption mainly beer. For exercise she is doing a water class, yoga, and pilates.   Social Drivers of Corporate investment banker Strain: Low Risk   (11/18/2023)   Overall Financial Resource Strain (CARDIA)    Difficulty of Paying Living Expenses: Not very hard  Food Insecurity: No Food Insecurity (11/18/2023)   Hunger Vital Sign    Worried About Running Out of Food in the Last Year: Never true    Ran Out of Food in the Last Year: Never true  Transportation Needs: No Transportation Needs (11/18/2023)   PRAPARE - Administrator, Civil Service (Medical): No    Lack of Transportation (Non-Medical): No  Physical Activity: Sufficiently Active (11/18/2023)   Exercise Vital Sign    Days of Exercise per Week: 6 days    Minutes of Exercise per Session: 60 min  Stress: Stress Concern Present (11/18/2023)   [employer] of Occupational Health - Occupational Stress Questionnaire    Feeling of Stress: To some extent  Social Connections: Unknown (11/18/2023)   Social Connection and Isolation Panel    Frequency of Communication with Friends and Family: Twice a week    Frequency of Social Gatherings with Friends and Family: Patient declined    Attends Religious Services: Never    Database administrator or Organizations: No    Attends Engineer, structural: Never    Marital Status: Married    Tobacco Counseling Counseling given: No   Clinical Intake:  Pre-visit preparation completed: Yes  Pain : No/denies pain Pain Score: 0-No pain     BMI - recorded: 30.54 Nutritional Status: BMI > 30  Obese Nutritional Risks: None Diabetes: No  How often do you need to have someone help you when you read instructions, pamphlets, or other written materials from your doctor or pharmacy?: 1 - Never  Interpreter Needed?: No  Information entered by :: Kathlynn Porto, CMA   Activities of Daily Living    11/18/2023    9:36 AM 11/14/2023    8:56 AM  In your present state of health, do you have any difficulty performing the following activities:  Hearing? 0 0  Vision? 0 0  Difficulty concentrating or making decisions? 0 0  Walking  or climbing stairs? 0 0  Dressing or bathing? 0 0  Doing errands, shopping? 0 0  Preparing Food and eating ? N N  Using the Toilet? N N  In the past six months, have you accidently leaked urine? N Y  Do you have problems with loss of bowel control? N N  Managing your Medications? N N  Managing your Finances? N N  Housekeeping or managing your Housekeeping? N N    Patient Care Team: Perri Ronal PARAS, MD as PCP - General (Internal Medicine)  Indicate any recent Medical Services you may have received from other than [facility] providers in the past year (  date may be approximate).     Assessment:   This is a routine wellness examination for Gwendolyn Herman.   Goals Addressed   None    Depression Screen    11/18/2023    9:33 AM 10/26/2023    2:19 PM 10/03/2022   11:09 AM 05/29/2022    2:19 PM 02/25/2022    2:25 PM 08/29/2021    3:29 PM 07/19/2020   11:08 AM  PHQ 2/9 Scores  PHQ - 2 Score 0 0 0 0 0 0 0    Fall Risk    11/18/2023    9:33 AM 11/14/2023    8:56 AM 10/26/2023    2:18 PM 09/25/2023   12:06 PM 10/03/2022   11:08 AM  Fall Risk   Falls in the past year? 0 0 0 0 0  Number falls in past yr: 0 0 0 0 0  Injury with Fall? 0 0 0 0 0  Risk for fall due to : No Fall Risks  No Fall Risks No Fall Risks No Fall Risks  Follow up Falls prevention discussed;Education provided;Falls evaluation completed  Falls evaluation completed Falls evaluation completed Falls evaluation completed    MEDICARE RISK AT HOME: Medicare Risk at Home Any stairs in or around the home?: Yes If so, are there any without handrails?: Yes Home free of loose throw rugs in walkways, pet beds, electrical cords, etc?: Yes Adequate lighting in your home to reduce risk of falls?: Yes Life alert?: No Use of a cane, walker or w/c?: No Grab bars in the bathroom?: No Shower chair or bench in shower?: Yes Elevated toilet seat or a handicapped toilet?: Yes  TIMED UP AND GO:  Was the test performed?  No    Cognitive  Function:    10/26/2023    2:20 PM 08/29/2021    3:20 PM  MMSE - Mini Mental State Exam  Orientation to time 5 5  Orientation to Place 5 5  Registration 3 3  Attention/ Calculation 5 5  Recall 3 3  Language- name 2 objects 2 2  Language- repeat 1 1  Language- follow 3 step command 3 3  Language- read & follow direction 1 1  Write a sentence 1 1  Copy design 1 1  Total score 30 30        11/18/2023    9:33 AM 10/26/2023    2:20 PM 10/03/2022   11:09 AM 08/29/2021    3:36 PM  6CIT Screen  What Year? 0 points 0 points 0 points 0 points  What month? 0 points 0 points 0 points 0 points  What time? 0 points 0 points 0 points 0 points  Count back from 20 0 points 0 points 0 points 0 points  Months in reverse 0 points 0 points 2 points 0 points  Repeat phrase 0 points 0 points 0 points 0 points  Total Score 0 points 0 points 2 points 0 points    Immunizations Immunization History  Administered Date(s) Administered   Influenza, Seasonal, Injecte, Preservative Fre 03/31/2023   Influenza,inj,Quad PF,6+ Mos 01/13/2019   Influenza-Unspecified 01/11/2022   PFIZER(Purple Top)SARS-COV-2 Vaccination 05/20/2019, 06/15/2019, 12/13/2019, 07/17/2020, 01/21/2021   PNEUMOCOCCAL CONJUGATE-20 01/11/2022   Pfizer(Comirnaty )Fall Seasonal Vaccine 12 years and older 04/01/2023   Pneumococcal Conjugate-13 05/19/2017   Respiratory Syncytial Virus Vaccine ,Recomb Aduvanted(Arexvy ) 02/05/2022   Td 10/13/2022   Tdap 07/14/2003, 11/01/2013   Unspecified SARS-COV-2 Vaccination 01/11/2022   Zoster Recombinant(Shingrix) 08/28/2020, 10/25/2020   Zoster, Live 11/22/2013  TDAP status: Due, Education has been provided regarding the importance of this vaccine. Advised may receive this vaccine at local pharmacy or Health Dept. Aware to provide a copy of the vaccination record if obtained from local pharmacy or Health Dept. Verbalized acceptance and understanding.  Flu Vaccine status: Due, Education has been  provided regarding the importance of this vaccine. Advised may receive this vaccine at local pharmacy or Health Dept. Aware to provide a copy of the vaccination record if obtained from local pharmacy or Health Dept. Verbalized acceptance and understanding.  Pneumococcal vaccine status: Up to date   Covid-19 vaccine status: Information provided on how to obtain vaccines.   Qualifies for Shingles Vaccine? Yes   Zostavax completed Yes   Shingrix Completed?: Yes  Screening Tests Health Maintenance  Topic Date Due   OPHTHALMOLOGY EXAM  07/01/2023   COVID-19 Vaccine (8 - 2024-25 season) 09/30/2023   INFLUENZA VACCINE  11/13/2023   FOOT EXAM  03/30/2024   HEMOGLOBIN A1C  04/23/2024   Diabetic kidney evaluation - eGFR measurement  10/21/2024   Diabetic kidney evaluation - Urine ACR  10/21/2024   Medicare Annual Wellness (AWV)  10/25/2024   MAMMOGRAM  05/18/2025   Fecal DNA (Cologuard)  11/11/2026   DTaP/Tdap/Td (4 - Td or Tdap) 10/12/2032   Pneumococcal Vaccine: 50+ Years  Completed   DEXA SCAN  Completed   Zoster Vaccines- Shingrix  Completed   Hepatitis B Vaccines  Aged Out   HPV VACCINES  Aged Out   Meningococcal B Vaccine  Aged Out   Colonoscopy  Discontinued   Hepatitis C Screening  Discontinued    Health Maintenance  Health Maintenance Due  Topic Date Due   OPHTHALMOLOGY EXAM  07/01/2023   COVID-19 Vaccine (8 - 2024-25 season) 09/30/2023   INFLUENZA VACCINE  11/13/2023    Colorectal cancer screening: Type of screening: Cologuard. Completed 11/11/2023. Repeat every 3 years  Mammogram status: Completed 05/19/2023. Repeat every year  Bone Density status: Completed 10/19/2023. Results reflect: Bone density results: OSTEOPENIA. Repeat every 2 years.  Lung Cancer Screening: (Low Dose CT Chest recommended if Age 39-80 years, 20 pack-year currently smoking OR have quit w/in 15years.) does not qualify.    Additional Screening:  Hepatitis C Screening: does not qualify;    Vision Screening: Recommended annual ophthalmology exams for early detection of glaucoma and other disorders of the eye. Is the patient up to date with their annual eye exam?  Yes  Who is the provider or what is the name of the office in which the patient attends annual eye exams? [facility] If pt is not established with a provider, would they like to be referred to a provider to establish care? No .   Dental Screening: Recommended annual dental exams for proper oral hygiene  Community Resource Referral / Chronic Care Management: CRR required this visit?  No   CCM required this visit?  No     Plan:     I have personally reviewed and noted the following in the patient's chart:   Medical and social history Use of alcohol, tobacco or illicit drugs  Current medications and supplements including opioid prescriptions. Patient is not currently taking opioid prescriptions. Functional ability and status Nutritional status Physical activity Advanced directives List of other physicians Hospitalizations, surgeries, and ER visits in previous 12 months Vitals Screenings to include cognitive, depression, and falls Referrals and appointments  In addition, I have reviewed and discussed with patient certain preventive protocols, quality metrics, and best practice  recommendations. A written personalized care plan for preventive services as well as general preventive health recommendations were provided to patient.     Frank Pilger Zelda, CMA   11/18/2023   After Visit Summary: (Mail) Due to this being a telephonic visit, the after visit summary with patients personalized plan was offered to patient via mail

## 2023-11-18 NOTE — Patient Instructions (Signed)
 Next appointment: Follow up in one year for your annual wellness visit    Preventive Care 72 Years and Older, Female Preventive care refers to lifestyle choices and visits with your health care provider that can promote health and wellness. What does preventive care include? A yearly physical exam. This is also called an annual well check. Dental exams once or twice a year. Routine eye exams. Ask your health care provider how often you should have your eyes checked. Personal lifestyle choices, including: Daily care of your teeth and gums. Regular physical activity. Eating a healthy diet. Avoiding tobacco and drug use. Limiting alcohol use. Practicing safe sex. Taking low-dose aspirin every day. Taking vitamin and mineral supplements as recommended by your health care provider. What happens during an annual well check? The services and screenings done by your health care provider during your annual well check will depend on your age, overall health, lifestyle risk factors, and family history of disease. Counseling  Your health care provider may ask you questions about your: Alcohol use. Tobacco use. Drug use. Emotional well-being. Home and relationship well-being. Sexual activity. Eating habits. History of falls. Memory and ability to understand (cognition). Work and work Astronomer. Reproductive health. Screening  You may have the following tests or measurements: Height, weight, and BMI. Blood pressure. Lipid and cholesterol levels. These may be checked every 5 years, or more frequently if you are over 1 years old. Skin check. Lung cancer screening. You may have this screening every year starting at age 72 if you have a 30-pack-year history of smoking and currently smoke or have quit within the past 15 years. Fecal occult blood test (FOBT) of the stool. You may have this test every year starting at age 72. Flexible sigmoidoscopy or colonoscopy. You may have a  sigmoidoscopy every 5 years or a colonoscopy every 10 years starting at age 72. Hepatitis C blood test. Hepatitis B blood test. Sexually transmitted disease (STD) testing. Diabetes screening. This is done by checking your blood sugar (glucose) after you have not eaten for a while (fasting). You may have this done every 1-3 years. Bone density scan. This is done to screen for osteoporosis. You may have this done starting at age 72. Mammogram. This may be done every 1-2 years. Talk to your health care provider about how often you should have regular mammograms. Talk with your health care provider about your test results, treatment options, and if necessary, the need for more tests. Vaccines  Your health care provider may recommend certain vaccines, such as: Influenza vaccine. This is recommended every year. Tetanus, diphtheria, and acellular pertussis (Tdap, Td) vaccine. You may need a Td booster every 10 years. Zoster vaccine. You may need this after age 72. Pneumococcal 13-valent conjugate (PCV13) vaccine. One dose is recommended after age 72. Pneumococcal polysaccharide (PPSV23) vaccine. One dose is recommended after age 72. Talk to your health care provider about which screenings and vaccines you need and how often you need them. This information is not intended to replace advice given to you by your health care provider. Make sure you discuss any questions you have with your health care provider. Document Released: 04/27/2015 Document Revised: 12/19/2015 Document Reviewed: 01/30/2015 Elsevier Interactive Patient Education  2017 [employer].  Fall Prevention in the Home Falls can cause injuries. They can happen to people of all ages. There are many things you can do to make your home safe and to help prevent falls. What can I do on the outside of  my home? Regularly fix the edges of walkways and driveways and fix any cracks. Remove anything that might make you trip as you walk through a  door, such as a raised step or threshold. Trim any bushes or trees on the path to your home. Use bright outdoor lighting. Clear any walking paths of anything that might make someone trip, such as rocks or tools. Regularly check to see if handrails are loose or broken. Make sure that both sides of any steps have handrails. Any raised decks and porches should have guardrails on the edges. Have any leaves, snow, or ice cleared regularly. Use sand or salt on walking paths during winter. Clean up any spills in your garage right away. This includes oil or grease spills. What can I do in the bathroom? Use night lights. Install grab bars by the toilet and in the tub and shower. Do not use towel bars as grab bars. Use non-skid mats or decals in the tub or shower. If you need to sit down in the shower, use a plastic, non-slip stool. Keep the floor dry. Clean up any water that spills on the floor as soon as it happens. Remove soap buildup in the tub or shower regularly. Attach bath mats securely with double-sided non-slip rug tape. Do not have throw rugs and other things on the floor that can make you trip. What can I do in the bedroom? Use night lights. Make sure that you have a light by your bed that is easy to reach. Do not use any sheets or blankets that are too big for your bed. They should not hang down onto the floor. Have a firm chair that has side arms. You can use this for support while you get dressed. Do not have throw rugs and other things on the floor that can make you trip. What can I do in the kitchen? Clean up any spills right away. Avoid walking on wet floors. Keep items that you use a lot in easy-to-reach places. If you need to reach something above you, use a strong step stool that has a grab bar. Keep electrical cords out of the way. Do not use floor polish or wax that makes floors slippery. If you must use wax, use non-skid floor wax. Do not have throw rugs and other things  on the floor that can make you trip. What can I do with my stairs? Do not leave any items on the stairs. Make sure that there are handrails on both sides of the stairs and use them. Fix handrails that are broken or loose. Make sure that handrails are as long as the stairways. Check any carpeting to make sure that it is firmly attached to the stairs. Fix any carpet that is loose or worn. Avoid having throw rugs at the top or bottom of the stairs. If you do have throw rugs, attach them to the floor with carpet tape. Make sure that you have a light switch at the top of the stairs and the bottom of the stairs. If you do not have them, ask someone to add them for you. What else can I do to help prevent falls? Wear shoes that: Do not have high heels. Have rubber bottoms. Are comfortable and fit you well. Are closed at the toe. Do not wear sandals. If you use a stepladder: Make sure that it is fully opened. Do not climb a closed stepladder. Make sure that both sides of the stepladder are locked into place. Ask  someone to hold it for you, if possible. Clearly mark and make sure that you can see: Any grab bars or handrails. First and last steps. Where the edge of each step is. Use tools that help you move around (mobility aids) if they are needed. These include: Canes. Walkers. Scooters. Crutches. Turn on the lights when you go into a dark area. Replace any light bulbs as soon as they burn out. Set up your furniture so you have a clear path. Avoid moving your furniture around. If any of your floors are uneven, fix them. If there are any pets around you, be aware of where they are. Review your medicines with your doctor. Some medicines can make you feel dizzy. This can increase your chance of falling. Ask your doctor what other things that you can do to help prevent falls. This information is not intended to replace advice given to you by your health care provider. Make sure you discuss any  questions you have with your health care provider. Document Released: 01/25/2009 Document Revised: 09/06/2015 Document Reviewed: 05/05/2014 Elsevier Interactive Patient Education  2017 [employer].

## 2023-11-18 NOTE — Progress Notes (Addendum)
 Subjective:   Gwendolyn Herman is a 73 y.o. female who presents for Medicare Annual (Subsequent) preventive examination.  Visit Complete: Virtual I connected with  Gwendolyn Herman on 11/18/23 by a audio enabled telemedicine application and verified that I am speaking with the correct person using two identifiers.  Patient Location: Home  Provider Location: Office/Clinic  I discussed the limitations of evaluation and management by telemedicine. The patient expressed understanding and agreed to proceed.  Vital Signs: Because this visit was a virtual/telehealth visit, some criteria may be missing or patient reported. Any vitals not documented were not able to be obtained and vitals that have been documented are patient reported.  Patient Medicare AWV questionnaire was completed by the patient on 11/18/2023; I have confirmed that all information answered by patient is correct and no changes since this date.  Cardiac Risk Factors include: advanced age (>13men, >49 women);dyslipidemia;hypertension;obesity (BMI >30kg/m2)     Objective:    Today's Vitals   11/18/23 0931 11/18/23 0935  Weight: 167 lb (75.8 kg)   Height: 5' 2 (1.575 m)   PainSc:  0-No pain   Body mass index is 30.54 kg/m.     11/18/2023    9:32 AM 10/26/2023    2:18 PM 10/03/2022   11:08 AM 08/29/2021    3:38 PM  Advanced Directives  Does Patient Have a Medical Advance Directive? No Unable to assess, patient is non-responsive or altered mental status No No  Would patient like information on creating a medical advance directive? No - Patient declined  No - Patient declined No - Patient declined    Current Medications (verified) Outpatient Encounter Medications as of 11/18/2023  Medication Sig   ALPRAZolam  (XANAX ) 0.5 MG tablet Take 1 tablet (0.5 mg total) by mouth 2 (two) times daily as needed for anxiety.   amLODipine  (NORVASC ) 5 MG tablet Take 1 tablet (5 mg total) by mouth every morning.   atorvastatin  (LIPITOR)  80 MG tablet Take 1 tablet (80 mg total) by mouth daily.   gabapentin  (NEURONTIN ) 300 MG capsule Take 1 capsule (300 mg total) by mouth 2 (two) times daily.   glipiZIDE  (GLUCOTROL  XL) 2.5 MG 24 hr tablet Take 2 tablets daily   ibandronate  (BONIVA ) 150 MG tablet Take 1 tablet (150 mg total) by mouth every 30 (thirty) days. Take in the morning with a full glass of water, on an empty stomach, and do not take anything else by mouth or lie down for the next 30 min.   losartan -hydrochlorothiazide (HYZAAR) 100-25 MG tablet Take 1 tablet by mouth daily.   metFORMIN  (GLUCOPHAGE ) 500 MG tablet Take 1 tablet (500 mg total) by mouth in the morning.   Vitamin D , Ergocalciferol , (DRISDOL ) 1.25 MG (50000 UNIT) CAPS capsule Take 1 capsule (50,000 Units total) by mouth once a week.   [DISCONTINUED] meloxicam  (MOBIC ) 15 MG tablet Take 1 tablet (15 mg total) by mouth daily. (Patient not taking: Reported on 10/26/2023)   [DISCONTINUED] methylPREDNISolone  (MEDROL ) 4 MG tablet Take in tapering course as directed 6-5-4-3-2-1 (Patient not taking: Reported on 10/26/2023)   No facility-administered encounter medications on file as of 11/18/2023.    Allergies (verified) Patient has no known allergies.   History: Past Medical History:  Diagnosis Date   Bilateral carpal tunnel syndrome    Hyperlipidemia    Hypertension    Past Surgical History:  Procedure Laterality Date   BREAST BIOPSY Right    Family History  Problem Relation Age of Onset   Heart  disease Mother    Stroke Mother    Cancer Father    Hyperlipidemia Sister    Hypertension Sister    Colon cancer Neg Hx    Esophageal cancer Neg Hx    Stomach cancer Neg Hx    Rectal cancer Neg Hx    Social History   Socioeconomic History   Marital status: Married    Spouse name: Not on file   Number of children: Not on file   Years of education: Not on file   Highest education level: Bachelor's degree (e.g., BA, AB, BS)  Occupational History   Not on file   Tobacco Use   Smoking status: Never   Smokeless tobacco: Never  Substance and Sexual Activity   Alcohol use: Yes    Alcohol/week: 2.0 - 3.0 standard drinks of alcohol    Types: 2 - 3 Cans of beer per week   Drug use: No   Sexual activity: Not on file  Other Topics Concern   Not on file  Social History Narrative   Married - husband is a Warden/ranger. Has adult children, and has grandchildren. Non-smoker, social alcohol consumption mainly beer. For exercise she is doing a water class, yoga, and pilates.   Social Drivers of Corporate investment banker Strain: Low Risk  (11/18/2023)   Overall Financial Resource Strain (CARDIA)    Difficulty of Paying Living Expenses: Not very hard  Food Insecurity: No Food Insecurity (11/18/2023)   Hunger Vital Sign    Worried About Running Out of Food in the Last Year: Never true    Ran Out of Food in the Last Year: Never true  Transportation Needs: No Transportation Needs (11/18/2023)   PRAPARE - Administrator, Civil Service (Medical): No    Lack of Transportation (Non-Medical): No  Physical Activity: Sufficiently Active (11/18/2023)   Exercise Vital Sign    Days of Exercise per Week: 6 days    Minutes of Exercise per Session: 60 min  Stress: Stress Concern Present (11/18/2023)   [employer] of Occupational Health - Occupational Stress Questionnaire    Feeling of Stress: To some extent  Social Connections: Unknown (11/18/2023)   Social Connection and Isolation Panel    Frequency of Communication with Friends and Family: Twice a week    Frequency of Social Gatherings with Friends and Family: Patient declined    Attends Religious Services: Never    Database administrator or Organizations: No    Attends Engineer, structural: Never    Marital Status: Married    Tobacco Counseling Counseling given: No   Clinical Intake:  Pre-visit preparation completed: Yes  Pain : No/denies pain Pain Score: 0-No pain     BMI -  recorded: 30.54 Nutritional Status: BMI > 30  Obese Nutritional Risks: None Diabetes: No  How often do you need to have someone help you when you read instructions, pamphlets, or other written materials from your doctor or pharmacy?: 1 - Never  Interpreter Needed?: No  Information entered by :: Kathlynn Porto, CMA   Activities of Daily Living    11/18/2023    9:36 AM 11/14/2023    8:56 AM  In your present state of health, do you have any difficulty performing the following activities:  Hearing? 0 0  Vision? 0 0  Difficulty concentrating or making decisions? 0 0  Walking or climbing stairs? 0 0  Dressing or bathing? 0 0  Doing errands, shopping? 0 0  Preparing Food  and eating ? N N  Using the Toilet? N N  In the past six months, have you accidently leaked urine? N Y  Do you have problems with loss of bowel control? N N  Managing your Medications? N N  Managing your Finances? N N  Housekeeping or managing your Housekeeping? N N    Patient Care Team: Perri Ronal PARAS, MD as PCP - General (Internal Medicine)  Indicate any recent Medical Services you may have received from other than [facility] providers in the past year (date may be approximate).     Assessment:   This is a routine wellness examination for Gwendolyn Herman.   Goals Addressed   None    Depression Screen    11/18/2023    9:33 AM 10/26/2023    2:19 PM 10/03/2022   11:09 AM 05/29/2022    2:19 PM 02/25/2022    2:25 PM 08/29/2021    3:29 PM 07/19/2020   11:08 AM  PHQ 2/9 Scores  PHQ - 2 Score 0 0 0 0 0 0 0    Fall Risk    11/18/2023    9:33 AM 11/14/2023    8:56 AM 10/26/2023    2:18 PM 09/25/2023   12:06 PM 10/03/2022   11:08 AM  Fall Risk   Falls in the past year? 0 0 0 0 0  Number falls in past yr: 0 0 0 0 0  Injury with Fall? 0 0 0 0 0  Risk for fall due to : No Fall Risks  No Fall Risks No Fall Risks No Fall Risks  Follow up Falls prevention discussed;Education provided;Falls evaluation completed  Falls evaluation  completed Falls evaluation completed Falls evaluation completed    MEDICARE RISK AT HOME: Medicare Risk at Home Any stairs in or around the home?: Yes If so, are there any without handrails?: Yes Home free of loose throw rugs in walkways, pet beds, electrical cords, etc?: Yes Adequate lighting in your home to reduce risk of falls?: Yes Life alert?: No Use of a cane, walker or w/c?: No Grab bars in the bathroom?: No Shower chair or bench in shower?: Yes Elevated toilet seat or a handicapped toilet?: Yes  TIMED UP AND GO:  Was the test performed?  No    Cognitive Function:    10/26/2023    2:20 PM 08/29/2021    3:20 PM  MMSE - Mini Mental State Exam  Orientation to time 5 5  Orientation to Place 5 5  Registration 3 3  Attention/ Calculation 5 5  Recall 3 3  Language- name 2 objects 2 2  Language- repeat 1 1  Language- follow 3 step command 3 3  Language- read & follow direction 1 1  Write a sentence 1 1  Copy design 1 1  Total score 30 30        11/18/2023    9:33 AM 10/26/2023    2:20 PM 10/03/2022   11:09 AM 08/29/2021    3:36 PM  6CIT Screen  What Year? 0 points 0 points 0 points 0 points  What month? 0 points 0 points 0 points 0 points  What time? 0 points 0 points 0 points 0 points  Count back from 20 0 points 0 points 0 points 0 points  Months in reverse 0 points 0 points 2 points 0 points  Repeat phrase 0 points 0 points 0 points 0 points  Total Score 0 points 0 points 2 points 0 points  Immunizations Immunization History  Administered Date(s) Administered   Influenza, Seasonal, Injecte, Preservative Fre 03/31/2023   Influenza,inj,Quad PF,6+ Mos 01/13/2019   Influenza-Unspecified 01/11/2022   PFIZER(Purple Top)SARS-COV-2 Vaccination 05/20/2019, 06/15/2019, 12/13/2019, 07/17/2020, 01/21/2021   PNEUMOCOCCAL CONJUGATE-20 01/11/2022   Pfizer(Comirnaty )Fall Seasonal Vaccine 12 years and older 04/01/2023   Pneumococcal Conjugate-13 05/19/2017   Respiratory  Syncytial Virus Vaccine,Recomb Aduvanted(Arexvy ) 02/05/2022   Td 10/13/2022   Tdap 07/14/2003, 11/01/2013   Unspecified SARS-COV-2 Vaccination 01/11/2022   Zoster Recombinant(Shingrix) 08/28/2020, 10/25/2020   Zoster, Live 11/22/2013    TDAP status: Due, Education has been provided regarding the importance of this vaccine. Advised may receive this vaccine at local pharmacy or Health Dept. Aware to provide a copy of the vaccination record if obtained from local pharmacy or Health Dept. Verbalized acceptance and understanding.  Flu Vaccine status: Due, Education has been provided regarding the importance of this vaccine. Advised may receive this vaccine at local pharmacy or Health Dept. Aware to provide a copy of the vaccination record if obtained from local pharmacy or Health Dept. Verbalized acceptance and understanding.  Pneumococcal vaccine status: Up to date  Covid-19 vaccine status: Information provided on how to obtain vaccines.   Qualifies for Shingles Vaccine? Yes   Zostavax completed Yes   Shingrix Completed?: Yes  Screening Tests Health Maintenance  Topic Date Due   OPHTHALMOLOGY EXAM  07/01/2023   COVID-19 Vaccine (8 - 2024-25 season) 09/30/2023   INFLUENZA VACCINE  11/13/2023   FOOT EXAM  03/30/2024   HEMOGLOBIN A1C  04/23/2024   Diabetic kidney evaluation - eGFR measurement  10/21/2024   Diabetic kidney evaluation - Urine ACR  10/21/2024   Medicare Annual Wellness (AWV)  11/17/2024   MAMMOGRAM  05/18/2025   Fecal DNA (Cologuard)  11/11/2026   DTaP/Tdap/Td (4 - Td or Tdap) 10/12/2032   Pneumococcal Vaccine: 50+ Years  Completed   DEXA SCAN  Completed   Zoster Vaccines- Shingrix  Completed   Hepatitis B Vaccines  Aged Out   HPV VACCINES  Aged Out   Meningococcal B Vaccine  Aged Out   Colonoscopy  Discontinued   Hepatitis C Screening  Discontinued    Health Maintenance  Health Maintenance Due  Topic Date Due   OPHTHALMOLOGY EXAM  07/01/2023   COVID-19 Vaccine  (8 - 2024-25 season) 09/30/2023   INFLUENZA VACCINE  11/13/2023    Colorectal cancer screening: Type of screening: Cologuard. Completed 11/11/2023. Repeat every 3 years  Mammogram status: Completed 05/19/2023. Repeat every year  Bone Density status: Completed 10/18/2021. Results reflect: Bone density results: OSTEOPENIA. Repeat every 2 years.  Lung Cancer Screening: (Low Dose CT Chest recommended if Age 61-80 years, 20 pack-year currently smoking OR have quit w/in 15years.) does not qualify.   Additional Screening:  Hepatitis C Screening: does not qualify;   Vision Screening: Recommended annual ophthalmology exams for early detection of glaucoma and other disorders of the eye. Is the patient up to date with their annual eye exam?  Yes  Who is the provider or what is the name of the office in which the patient attends annual eye exams? [facility] If pt is not established with a provider, would they like to be referred to a provider to establish care? No .   Dental Screening: Recommended annual dental exams for proper oral hygiene  Community Resource Referral / Chronic Care Management: CRR required this visit?  No   CCM required this visit?  No     Plan:     I have  personally reviewed and noted the following in the patient's chart:   Medical and social history Use of alcohol, tobacco or illicit drugs  Current medications and supplements including opioid prescriptions. Patient is not currently taking opioid prescriptions. Functional ability and status Nutritional status Physical activity Advanced directives List of other physicians Hospitalizations, surgeries, and ER visits in previous 12 months Vitals Screenings to include cognitive, depression, and falls Referrals and appointments  In addition, I have reviewed and discussed with patient certain preventive protocols, quality metrics, and best practice recommendations. A written personalized care plan for  preventive services as well as general preventive health recommendations were provided to patient.     Lunden Stieber Zelda, CMA   11/18/2023   After Visit Summary: (Mail) Due to this being a telephonic visit, the after visit summary with patients personalized plan was offered to patient via mail   I, Ronal JINNY Hailstone, MD, have reviewed all documentation for this visit. The documentation on 11/18/2023 for the exam, diagnosis, procedures, and orders are all accurate and complete.

## 2023-11-20 ENCOUNTER — Encounter: Payer: Self-pay | Admitting: Internal Medicine

## 2023-11-24 DIAGNOSIS — M5442 Lumbago with sciatica, left side: Secondary | ICD-10-CM | POA: Diagnosis not present

## 2023-11-24 DIAGNOSIS — H524 Presbyopia: Secondary | ICD-10-CM | POA: Diagnosis not present

## 2023-11-24 DIAGNOSIS — H35363 Drusen (degenerative) of macula, bilateral: Secondary | ICD-10-CM | POA: Diagnosis not present

## 2023-11-24 DIAGNOSIS — H2513 Age-related nuclear cataract, bilateral: Secondary | ICD-10-CM | POA: Diagnosis not present

## 2023-11-24 DIAGNOSIS — E119 Type 2 diabetes mellitus without complications: Secondary | ICD-10-CM | POA: Diagnosis not present

## 2023-11-24 LAB — HM DIABETES EYE EXAM

## 2023-12-08 ENCOUNTER — Other Ambulatory Visit

## 2023-12-08 DIAGNOSIS — E119 Type 2 diabetes mellitus without complications: Secondary | ICD-10-CM | POA: Diagnosis not present

## 2023-12-09 ENCOUNTER — Other Ambulatory Visit (HOSPITAL_BASED_OUTPATIENT_CLINIC_OR_DEPARTMENT_OTHER): Payer: Self-pay

## 2023-12-09 LAB — HEMOGLOBIN A1C
Hgb A1c MFr Bld: 6.1 % — ABNORMAL HIGH (ref <5.7)
Mean Plasma Glucose: 128 mg/dL
eAG (mmol/L): 7.1 mmol/L

## 2023-12-10 ENCOUNTER — Ambulatory Visit: Payer: Self-pay | Admitting: Internal Medicine

## 2023-12-15 ENCOUNTER — Other Ambulatory Visit

## 2023-12-17 ENCOUNTER — Ambulatory Visit: Admitting: Internal Medicine

## 2023-12-17 ENCOUNTER — Encounter: Payer: Self-pay | Admitting: Internal Medicine

## 2023-12-17 VITALS — BP 120/80 | HR 76 | Ht 62.0 in | Wt 161.0 lb

## 2023-12-17 DIAGNOSIS — G5601 Carpal tunnel syndrome, right upper limb: Secondary | ICD-10-CM | POA: Diagnosis not present

## 2023-12-17 DIAGNOSIS — M8580 Other specified disorders of bone density and structure, unspecified site: Secondary | ICD-10-CM

## 2023-12-17 DIAGNOSIS — E785 Hyperlipidemia, unspecified: Secondary | ICD-10-CM

## 2023-12-17 DIAGNOSIS — E119 Type 2 diabetes mellitus without complications: Secondary | ICD-10-CM | POA: Diagnosis not present

## 2023-12-17 DIAGNOSIS — Z7984 Long term (current) use of oral hypoglycemic drugs: Secondary | ICD-10-CM

## 2023-12-17 DIAGNOSIS — I10 Essential (primary) hypertension: Secondary | ICD-10-CM

## 2023-12-17 DIAGNOSIS — E559 Vitamin D deficiency, unspecified: Secondary | ICD-10-CM | POA: Diagnosis not present

## 2023-12-17 DIAGNOSIS — Z6829 Body mass index (BMI) 29.0-29.9, adult: Secondary | ICD-10-CM

## 2023-12-17 DIAGNOSIS — F419 Anxiety disorder, unspecified: Secondary | ICD-10-CM

## 2023-12-17 MED ORDER — METFORMIN HCL 500 MG PO TABS: 500.0000 mg | ORAL_TABLET | Freq: Two times a day (BID) | ORAL | 1 refills | Status: DC

## 2023-12-17 MED ORDER — METFORMIN HCL 500 MG PO TABS: 500.0000 mg | ORAL_TABLET | Freq: Two times a day (BID) | ORAL | 1 refills | Status: AC

## 2023-12-17 NOTE — Progress Notes (Signed)
 Patient Care Team: Perri Gwendolyn PARAS, MD as PCP - General (Internal Medicine)  Visit Date: 12/18/23  Subjective:    Patient ID: Gwendolyn Herman , Female   DOB: [birth year], 73 y.o.    MRN: [id number]   73 y.o. Female presents today for 1 month follow up for Diabetes Mellitus type 2.   Patient has a  history of Hyperlipidemia, Type 2 diabetes mellitus, Hypertension, Hx of Vitamin D  deficiency  and Anxiety.  History of hyperlipidemia treated with atorvastatin  80 mg daily.   History of type 2 Diabetes mellitus treated with metformin  500 mg twice daily , glipizide  5 mg daily with breakfast. HGBA1c was 6.1% on 12/08/2023. Had been 6.2% when checked October 22, 2023. Has been busy recently with beach trip with family. Her sister fell at the beach and patient spent time with sister in ED but she checked out OK.  History of hypertension treated with amlodipine  5 mg daily in the morning, losartan -hydrochlorothiazide 100-25 mg daily. Blood pressure today normal at 120/80  History of anxiety treated with Xanax  0.5 mg twice a day as needed    History of Osteopenia treated with Boniva  150 mg every 30 days. Last bone density study stable at -1.4 in femoral neck and July 2023  History of Vitamin D  deficiency treated  with Vitamin D  orally  50,000 units weekly     History of Carpal Tunnel syndrome- she is S/P a carpal tunnel release on her right hand performed Oct 4th 2024.   Vaccine Counseling: Influenza and Covid vaccine due     Past Medical History:  Diagnosis Date   Bilateral carpal tunnel syndrome    Hyperlipidemia    Hypertension      Family History  Problem Relation Age of Onset   Heart disease Mother    Stroke Mother    Cancer Father    Hyperlipidemia Sister    Hypertension Sister    Colon cancer Neg Hx    Esophageal cancer Neg Hx    Stomach cancer Neg Hx    Rectal cancer Neg Hx     Social History   Social History Narrative   Married - husband is a Warden/ranger. Has adult  children, and has grandchildren. Non-smoker, social alcohol consumption mainly beer. For exercise she is doing a water class, yoga, and pilates.      Review of Systems  Constitutional:  Negative for fever and malaise/fatigue.  HENT:  Negative for congestion.   Eyes:  Negative for blurred vision.  Respiratory:  Negative for cough and shortness of breath.   Cardiovascular:  Negative for chest pain, palpitations and leg swelling.  Gastrointestinal:  Negative for vomiting.  Musculoskeletal:  Negative for back pain.  Skin:  Negative for rash.  Neurological:  Negative for loss of consciousness and headaches.        Objective:   Vitals: BP 120/80   Pulse 76   Ht 5' 2 (1.575 m)   Wt 161 lb (73 kg)   SpO2 97%   BMI 29.45 kg/m    Physical Exam Vitals and nursing note reviewed.  Constitutional:      General: She is not in acute distress.    Appearance: Normal appearance. She is not ill-appearing or toxic-appearing.  HENT:     Head: Normocephalic and atraumatic.     Right Ear: Hearing, tympanic membrane, ear canal and external ear normal.     Left Ear: Hearing, tympanic membrane, ear canal and external ear normal.  Mouth/Throat:     Pharynx: Oropharynx is clear.  Eyes:     Extraocular Movements: Extraocular movements intact.     Pupils: Pupils are equal, round, and reactive to light.  Neck:     Thyroid : No thyroid  mass, thyromegaly or thyroid  tenderness.     Vascular: No carotid bruit.  Cardiovascular:     Rate and Rhythm: Normal rate and regular rhythm. No extrasystoles are present.    Pulses:          Dorsalis pedis pulses are 2+ on the right side and 2+ on the left side.     Heart sounds: Normal heart sounds. No murmur heard.    No friction rub. No gallop.  Pulmonary:     Effort: Pulmonary effort is normal.     Breath sounds: Normal breath sounds. No decreased breath sounds, wheezing, rhonchi or rales.  Chest:     Chest wall: No mass.  Abdominal:     Palpations:  Abdomen is soft. There is no hepatomegaly, splenomegaly or mass.     Tenderness: There is no abdominal tenderness.     Hernia: No hernia is present.  Musculoskeletal:     Cervical back: Normal range of motion.     Right lower leg: No edema.     Left lower leg: No edema.  Lymphadenopathy:     Cervical: No cervical adenopathy.     Upper Body:     Right upper body: No supraclavicular adenopathy.     Left upper body: No supraclavicular adenopathy.  Skin:    General: Skin is warm and dry.  Neurological:     General: No focal deficit present.     Mental Status: She is alert and oriented to person, place, and time. Mental status is at baseline.     Sensory: Sensation is intact.     Motor: Motor function is intact. No weakness.     Deep Tendon Reflexes: Reflexes are normal and symmetric.  Psychiatric:        Attention and Perception: Attention normal.        Mood and Affect: Mood normal.        Speech: Speech normal.        Behavior: Behavior normal.        Thought Content: Thought content normal.        Cognition and Memory: Cognition normal.        Judgment: Judgment normal.       Results:   Studies obtained and personally reviewed by me:   Mammogram 05/22/2023:  Negative,  repeat in one year    Labs:       Component Value Date/Time   NA 139 10/22/2023 0922   K 4.5 10/22/2023 0922   CL 104 10/22/2023 0922   CO2 26 10/22/2023 0922   GLUCOSE 116 (H) 10/22/2023 0922   BUN 17 10/22/2023 0922   CREATININE 0.63 10/22/2023 0922   CALCIUM  9.3 10/22/2023 0922   PROT 6.6 10/22/2023 0922   ALBUMIN 4.0 10/21/2016 0905   AST 19 10/22/2023 0922   ALT 22 10/22/2023 0922   ALKPHOS 59 10/21/2016 0905   BILITOT 0.7 10/22/2023 0922   GFRNONAA 85 07/17/2020 0923   GFRAA 98 07/17/2020 0923     Lab Results  Component Value Date   WBC 4.0 10/22/2023   HGB 13.1 10/22/2023   HCT 40.9 10/22/2023   MCV 96.5 10/22/2023   PLT 310 10/22/2023    Lab Results  Component Value Date  CHOL 173 10/22/2023   HDL 67 10/22/2023   LDLCALC 87 10/22/2023   TRIG 94 10/22/2023   CHOLHDL 2.6 10/22/2023    Lab Results  Component Value Date   HGBA1C 6.1 (H) 12/08/2023     Lab Results  Component Value Date   TSH 0.82 10/22/2023          Assessment & Plan:  Hyperlipidemia: treated with atorvastatin  80 mg daily. Lipid panel July 10 was normal.  Diabetes Mellitus, Type 2: treated with metformin  500 mg twice daily, glipizide  5 mg daily  HGBA1c at 6.1%% up from 6.1% in August.    Essential Hypertension: treated with amlodipine  5 mg daily  and  losartan -Hydrochlorothiazide 100-25 mg daily. Blood pressure today normal at 120/80  Anxiety: treated with Xanax  0.5 mg twice a day as needed    Osteopenia: treated with Boniva  150 mg every 30 days. Last bone density study stable at -1.4 in femoral neck and July 2023  Vitamin D  deficiently: treated  with Vitamin D  50000 units weekly     Carpal Tunnel syndrome: she is S/P a carpal tunnel release on her right hand, done on Oct 4th 2024.   Vaccine Counseling: Influenza and Covid vaccine due   Plan: continue current meds, continue to work on diet and exercise efforts. RTC in January for follow up. CPE and Medicare wellness was done July 2025.SABRA LILLETTE Blue C Reid,acting as a scribe for Gwendolyn JINNY Hailstone, MD.,have documented all relevant documentation on the behalf of Gwendolyn JINNY Hailstone, MD,as directed by  Gwendolyn JINNY Hailstone, MD while in the presence of Gwendolyn JINNY Hailstone, MD.   I, Gwendolyn JINNY Hailstone, MD, have reviewed all documentation for this visit. The documentation on 12/17/2023 for the exam, diagnosis, procedures, and orders are all accurate and complete.

## 2023-12-20 NOTE — Patient Instructions (Signed)
 It was a pleasure to see you today.  Hemoglobin A1c has only improved slightly but we understand you have been at the with family.  Continue to work on diet. Continue glipizide  and metformin .  Continue Boniva  150 mg monthly.  Continue Xanax  twice daily as needed.  Continue vitamin D  50,000 units weekly.  Continue with your exercise regimen as discussed.  Return in January for follow-up.  We are glad your musculoskeletal pain has improved.

## 2023-12-29 DIAGNOSIS — M5442 Lumbago with sciatica, left side: Secondary | ICD-10-CM | POA: Diagnosis not present

## 2024-01-08 ENCOUNTER — Other Ambulatory Visit (HOSPITAL_BASED_OUTPATIENT_CLINIC_OR_DEPARTMENT_OTHER): Payer: Self-pay

## 2024-01-08 MED ORDER — FLUZONE HIGH-DOSE 0.5 ML IM SUSY
0.5000 mL | PREFILLED_SYRINGE | Freq: Once | INTRAMUSCULAR | 0 refills | Status: AC
Start: 2024-01-08 — End: 2024-01-09
  Filled 2024-01-08: qty 0.5, 1d supply, fill #0

## 2024-01-08 MED ORDER — COMIRNATY 30 MCG/0.3ML IM SUSY
0.3000 mL | PREFILLED_SYRINGE | Freq: Once | INTRAMUSCULAR | 0 refills | Status: AC
  Filled 2024-01-08: qty 0.3, 1d supply, fill #0

## 2024-01-19 ENCOUNTER — Telehealth: Payer: Self-pay | Admitting: Internal Medicine

## 2024-01-19 ENCOUNTER — Encounter: Payer: Self-pay | Admitting: Internal Medicine

## 2024-01-19 DIAGNOSIS — Z0279 Encounter for issue of other medical certificate: Secondary | ICD-10-CM

## 2024-02-08 ENCOUNTER — Ambulatory Visit: Admitting: Family Medicine

## 2024-02-08 ENCOUNTER — Other Ambulatory Visit: Payer: Self-pay | Admitting: Family Medicine

## 2024-02-08 ENCOUNTER — Encounter: Payer: Self-pay | Admitting: Family Medicine

## 2024-02-08 ENCOUNTER — Ambulatory Visit: Payer: Self-pay

## 2024-02-08 ENCOUNTER — Ambulatory Visit
Admission: RE | Admit: 2024-02-08 | Discharge: 2024-02-08 | Disposition: A | Discharge status: Home / Self Care | Source: Ambulatory Visit | Attending: Family Medicine | Admitting: Family Medicine

## 2024-02-08 VITALS — BP 110/82 | Ht 62.0 in | Wt 156.0 lb

## 2024-02-08 DIAGNOSIS — S83281A Other tear of lateral meniscus, current injury, right knee, initial encounter: Secondary | ICD-10-CM

## 2024-02-08 DIAGNOSIS — M25561 Pain in right knee: Secondary | ICD-10-CM

## 2024-02-08 DIAGNOSIS — M1711 Unilateral primary osteoarthritis, right knee: Secondary | ICD-10-CM | POA: Diagnosis not present

## 2024-02-08 NOTE — Progress Notes (Signed)
 DATE OF VISIT: 02/08/2024        Gwendolyn Herman DOB: 28-Dec-1950 MRN: [id number]  CC:  Rt knee pain  History of present Illness: Gwendolyn Herman is a 73 y.o. female who presents for evaluation of right knee pain Last seen for this by us  11/18/2018 - seen by Dr Harvey  - MSK u/s at the time showing partial tear of lateral meniscus  - was recommended compression sleeve and ice.   Accompanied by husband today [employer]viser)  He reports that knee was doing well, surgery experience increasing pain along the lateral posterior aspect of the knee approximately 2 weeks ago They were traveling in [place], they flew from [place] to [place] - In [place] airport she was stepping on a moving sidewalk and started to feel immediate pain in the posterior lateral aspect of her knee.  She did not fall - Was having pain with walking - Husband notes that she was limping through the airport and throughout the duration of their trip Was having intermittent pain and feelings of instability throughout the trip Worse when walking down hills and on uneven sidewalks Does note some occasional clicking Denies any locking Has been using a knee sleeve which has been helpful Return to [place] on Friday, rested throughout the weekend Did go to Pilates today, avoided any kneeling and exercises with that knee She enjoys walking, would like to try to get back into this She has not had any recent imaging, no prior x-rays of the knee  Medications:  Outpatient Encounter Medications as of 02/08/2024  Medication Sig   ALPRAZolam  (XANAX ) 0.5 MG tablet Take 1 tablet (0.5 mg total) by mouth 2 (two) times daily as needed for anxiety.   amLODipine  (NORVASC ) 5 MG tablet Take 1 tablet (5 mg total) by mouth every morning.   atorvastatin  (LIPITOR) 80 MG tablet Take 1 tablet (80 mg total) by mouth daily.   gabapentin  (NEURONTIN ) 300 MG capsule Take 1 capsule (300 mg total) by mouth 2 (two) times daily.   glipiZIDE  (GLUCOTROL  XL) 2.5 MG  24 hr tablet Take 2 tablets daily   ibandronate  (BONIVA ) 150 MG tablet Take 1 tablet (150 mg total) by mouth every 30 (thirty) days. Take in the morning with a full glass of water, on an empty stomach, and do not take anything else by mouth or lie down for the next 30 min.   losartan -hydrochlorothiazide (HYZAAR) 100-25 MG tablet Take 1 tablet by mouth daily.   metFORMIN  (GLUCOPHAGE ) 500 MG tablet Take 1 tablet (500 mg total) by mouth 2 (two) times daily with a meal.   Vitamin D , Ergocalciferol , (DRISDOL ) 1.25 MG (50000 UNIT) CAPS capsule Take 1 capsule (50,000 Units total) by mouth once a week.   No facility-administered encounter medications on file as of 02/08/2024.    Allergies: has no known allergies.  Physical Examination: Vitals: BP 110/82   Ht 5' 2 (1.575 m)   Wt 156 lb (70.8 kg)   BMI 28.53 kg/m  GENERAL:  Gwendolyn Herman is a 73 y.o. female appearing their stated age, alert and oriented x 3, in no apparent distress.  SKIN: no rashes or lesions, skin clean, dry, intact MSK: Knee: Right knee with trace effusion.  No increased redness or warmth.  Full range of motion with slight pain at terminal flexion.  Tender to palpation over the lateral joint line, mild tenderness along the medial joint line.  Slight discomfort with McMurray, no palpable click.  Negative varus and valgus stress. Left  knee with full range of motion without pain, weakness, instability Walking without a limp NEURO: sensation intact to light touch lower extremity bilaterally VASC: pulses 2+ and symmetric DP/PT bilaterally, no edema  Radiology: Limited MSK ultrasound right knee Date: 02/08/2024 Indication: Right knee pain Findings: -Physiologic fluid noted within the suprapatellar pouch.  No significant effusion.  Normal-appearing quad tendon - Does have some hypoechoic change of the proximal patellar tendon.  No increased Doppler flow.  Does not have any tenderness with sono palpation or manual palpation in  this area - Lateral joint line with fragmentation and noted tear of the lateral meniscus.  Small area of surrounding edema - Medial meniscus without any gross abnormalities.  No swelling.  No definitive tears - Popliteal fossa with possible small Baker's cyst, no other abnormalities  Impression: - Lateral meniscus tear - Possible small Baker's cyst Images and interpretation completed by Ozell Provencal, MD PGY3 and Rainell Cedar, DO    Assessment & Plan  1. Acute pain of right knee 2. Tear of lateral meniscus of right knee, current, unspecified tear type, initial encounter Acute right knee pain with prior history of lateral meniscus tear noted on MSK ultrasound in 2020.  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  Recommend right knee x-rays to rule out osteoarthritis.  I will reach out through MyChart with the results.  She plans to get these today - Will continue to use body helix knee sleeve for compression and support with activity - Recommended Voltaren gel every 6-8 hours over the next week, then as needed thereafter -PT referral given.  She has gone to [facility] in the past.  Also provided home exercise program to get started prior to official PT - Can gradually start to return to activity, starting off with light walking on flat surfaces - Follow-up 4 to 6 weeks if worsening or not improving.  Pending x-rays and her response to above therapy, could consider MRI to further evaluate deeper into the joint   Patient expressed understanding & agreement with above.  Encounter Diagnoses  Name Primary?   Acute pain of right knee Yes   Tear of lateral meniscus of right knee, current, unspecified tear type, initial encounter     Orders Placed This Encounter  Procedures   US  LIMITED JOINT SPACE STRUCTURES LOW RIGHT   DG  Knee 4 Views W/Patella Right

## 2024-02-12 ENCOUNTER — Ambulatory Visit: Payer: Self-pay | Admitting: Family Medicine

## 2024-02-12 NOTE — Progress Notes (Signed)
X-rays reviewed MyChart message sent

## 2024-03-16 DIAGNOSIS — M1731 Unilateral post-traumatic osteoarthritis, right knee: Secondary | ICD-10-CM | POA: Diagnosis not present

## 2024-03-16 DIAGNOSIS — S83281A Other tear of lateral meniscus, current injury, right knee, initial encounter: Secondary | ICD-10-CM | POA: Diagnosis not present

## 2024-03-16 DIAGNOSIS — M25561 Pain in right knee: Secondary | ICD-10-CM | POA: Diagnosis not present

## 2024-03-23 DIAGNOSIS — M1731 Unilateral post-traumatic osteoarthritis, right knee: Secondary | ICD-10-CM | POA: Diagnosis not present

## 2024-03-23 DIAGNOSIS — M25561 Pain in right knee: Secondary | ICD-10-CM | POA: Diagnosis not present

## 2024-03-23 DIAGNOSIS — S83281A Other tear of lateral meniscus, current injury, right knee, initial encounter: Secondary | ICD-10-CM | POA: Diagnosis not present

## 2024-03-25 NOTE — Telephone Encounter (Signed)
 done

## 2024-03-28 DIAGNOSIS — M1731 Unilateral post-traumatic osteoarthritis, right knee: Secondary | ICD-10-CM | POA: Diagnosis not present

## 2024-03-28 DIAGNOSIS — S83281A Other tear of lateral meniscus, current injury, right knee, initial encounter: Secondary | ICD-10-CM | POA: Diagnosis not present

## 2024-03-28 DIAGNOSIS — M25561 Pain in right knee: Secondary | ICD-10-CM | POA: Diagnosis not present

## 2024-04-05 ENCOUNTER — Other Ambulatory Visit: Payer: Self-pay

## 2024-04-05 MED ORDER — GLIPIZIDE ER 2.5 MG PO TB24: ORAL_TABLET | ORAL | 3 refills | Status: DC

## 2024-04-11 ENCOUNTER — Ambulatory Visit (INDEPENDENT_AMBULATORY_CARE_PROVIDER_SITE_OTHER): Admitting: Family Medicine

## 2024-04-11 ENCOUNTER — Encounter: Payer: Self-pay | Admitting: Family Medicine

## 2024-04-11 VITALS — BP 118/80 | Ht 62.0 in | Wt 155.0 lb

## 2024-04-11 DIAGNOSIS — G5602 Carpal tunnel syndrome, left upper limb: Secondary | ICD-10-CM

## 2024-04-11 NOTE — Progress Notes (Signed)
 DATE OF VISIT: 04/11/2024        Gwendolyn Herman DOB: Apr 10, 1951 MRN: [id number]  Discussed the use of AI scribe software for clinical note transcription with the patient, who gave verbal consent to proceed.  History of Present Illness Gwendolyn Herman is a 73 year old female with prior right carpal tunnel release who presents with two weeks of left index finger pain and paresthesia. RHD  Left index finger pain and paresthesia - Acute onset pain localized to the tip of the left index finger beginning December 14th, without identifiable trauma. - Initial pain was severe, throbbing, and burning, occasionally radiating up the arm. - Symptoms were most pronounced during the first week, sometimes initiating in the evening, but did not consistently disrupt sleep. - Over the past week, pain has improved to mild intensity (3/10), with persistent tingling and decreased sensitivity at the fingertip. - Nocturnal numbness or sleep disruption absent. - No symptoms typical of prior carpal tunnel syndrome on the right. - Discomfort exacerbated by direct pressure at the base of the left hand, such as during weight-bearing activities in Pilates or when placing objects in cupboards. - Symptom onset coincided with use of a new, tight watch on the left wrist, which began in October; removal and switching to the right wrist resulted in improvement. - She has modified activities and discontinued use of the watch on the affected side. - Expresses concern regarding recurrence, particularly with upcoming international travel.  Response to treatment and symptom management - Trialed gabapentin  and acetaminophen during acute episodes, which provided relief. - Has not required analgesics in the past week. - Splinting and compression gloves were ineffective and occasionally worsened symptoms.  History of right carpal tunnel syndrome - Prior right carpal tunnel release over a year ago. - Persistent decreased grip  strength and difficulty with tasks requiring manual dexterity since surgery.  Functional status - Remains physically active. - Preparing for travel to [place] on May 19, 2024, and aims to maintain functional independence.  Already plans to check bags and travel light    Medications:  Outpatient Encounter Medications as of 04/11/2024  Medication Sig   ALPRAZolam  (XANAX ) 0.5 MG tablet Take 1 tablet (0.5 mg total) by mouth 2 (two) times daily as needed for anxiety.   amLODipine  (NORVASC ) 5 MG tablet Take 1 tablet (5 mg total) by mouth every morning.   atorvastatin  (LIPITOR) 80 MG tablet Take 1 tablet (80 mg total) by mouth daily.   gabapentin  (NEURONTIN ) 300 MG capsule Take 1 capsule (300 mg total) by mouth 2 (two) times daily.   glipiZIDE  (GLUCOTROL  XL) 2.5 MG 24 hr tablet Take 2 tablets daily   ibandronate  (BONIVA ) 150 MG tablet Take 1 tablet (150 mg total) by mouth every 30 (thirty) days. Take in the morning with a full glass of water, on an empty stomach, and do not take anything else by mouth or lie down for the next 30 min.   losartan -hydrochlorothiazide (HYZAAR) 100-25 MG tablet Take 1 tablet by mouth daily.   metFORMIN  (GLUCOPHAGE ) 500 MG tablet Take 1 tablet (500 mg total) by mouth 2 (two) times daily with a meal.   Vitamin D , Ergocalciferol , (DRISDOL ) 1.25 MG (50000 UNIT) CAPS capsule Take 1 capsule (50,000 Units total) by mouth once a week.   No facility-administered encounter medications on file as of 04/11/2024.    Allergies: has no known allergies.  Physical Examination: Vitals: BP 118/80   Ht 5' 2 (1.575 m)   Wt 155 lb (  70.3 kg)   BMI 28.35 kg/m  GENERAL:  Gwendolyn Herman is a 73 y.o. female appearing their stated age, alert and oriented x 3, in no apparent distress.  SKIN: no rashes or lesions, skin clean, dry, intact MSK: Hand/wrist: Left hand and wrist without any gross deformity.  No thenar or hypothenar atrophy.  No swelling.  No increased redness or warmth.  No  tenderness throughout the carpals, metacarpals, phalanges.  Normal grip strength.  Positive Tinel's at the carpal tunnel on the left, negative Phalen's and negative augmented Phalen's. Right hand and wrist with well-healed carpal tunnel release scar.  Negative Tinel's at the carpal tunnel, negative Phalen's.  Normal grip strength. NEURO: sensation intact to light touch, DTR 2/4 bicep, tricep, brachial radialis bilaterally VASC: pulses 2+ and symmetric radial artery bilaterally, no edema  Assessment & Plan Left index finger pain, numbness, tingling suspicious for mild carpal tunnel syndrome Symptoms consistent with mild left carpal tunnel syndrome, likely due to median nerve irritation or compression. Condition is mild, improving, without significant nocturnal symptoms or functional impairment. - Use carpal tunnel brace at bedtime to maintain wrist in neutral position.  Can also use during the day as needed - Bring carpal tunnel brace while traveling, especially in airports and when pulling luggage. - Discussed gabapentin  use at bedtime if pain becomes bothersome. - Avoid activities in Pilates or exercises placing direct pressure on wrist; suggested modifications with dumbbells, yoga blocks, or forearm-based exercises. - Avoid tight watch on left wrist. - Provide instruction on carpal tunnel stretches and movements, ensuring she does not exacerbate symptoms. - Monitor symptoms and return if symptoms worsen or fail to improve within 1-2 weeks.  Patient expressed understanding & agreement with above.  Encounter Diagnosis  Name Primary?   Left carpal tunnel syndrome Yes    No orders of the defined types were placed in this encounter.    Contains text generated by [employer].

## 2024-04-21 ENCOUNTER — Other Ambulatory Visit: Payer: Self-pay | Admitting: Internal Medicine

## 2024-04-21 DIAGNOSIS — Z1231 Encounter for screening mammogram for malignant neoplasm of breast: Secondary | ICD-10-CM

## 2024-04-28 NOTE — Progress Notes (Addendum)
 "   Patient Care Team: Perri Ronal PARAS, MD as PCP - General (Internal Medicine)  Visit Date: 05/12/24  Subjective:    Patient ID: Gwendolyn Herman , Female   DOB: [birth year], 74 y.o.    MRN: [id number]   74 y.o. Female presents today for  6 month  follow up for Diabetes with Hyperlipidemia, and  Essential hypertension.   Patient  also has a history of Anxiety, Osteopenia, Vitamin D  deficiency.  She is going on a trip to [place] in about a week and requests medications for foreign travel in case she becomes ill while traveling overseas.    Hyperlipidemia is  treated with atorvastatin  80 mg daily.    Diabetes mellitus,  type II  is treated with metformin  500 mg twice daily , glipizide  5 mg daily with breakfast.     Essential Hypertension treated with amlodipine  5 mg daily in the morning, losartan -hydrochlorothiazide 100-25 mg daily. Blood pressure is excellent at  110/80.  History of Anxiety treated with Xanax  0.5 mg twice a day as needed     History of Osteopenia treated with Boniva  150 mg every 30 days. Last bone density study stable at -1.4 in femoral neck and July 2023. Due for bone density study in the near future.   History of Vitamin D  deficiency treated  with Vitamin D   50,000 units  by mouth weekly.       History of Carpal Tunnel syndrome- she is S/P a carpal tunnel release on her right hand performed Oct 4th 2024.     Past Medical History:  Diagnosis Date   Bilateral carpal tunnel syndrome    Hyperlipidemia    Hypertension      Family History  Problem Relation Age of Onset   Heart disease Mother    Stroke Mother    Cancer Father    Hyperlipidemia Sister    Hypertension Sister    Arthritis Sister    Cancer Paternal Uncle    Diabetes Paternal Uncle    Colon cancer Neg Hx    Esophageal cancer Neg Hx    Stomach cancer Neg Hx    Rectal cancer Neg Hx     Social History   Social History Narrative   Married - husband is a warden/ranger. Has adult children, and  has grandchildren. Non-smoker, social alcohol consumption mainly beer. For exercise she is doing a water class, yoga, and pilates.      Review of Systems  All other systems reviewed and are negative.       Objective:   Vitals: BP 110/80   Pulse 78   Ht 5' 2 (1.575 m)   Wt 156 lb (70.8 kg)   SpO2 98%   BMI 28.53 kg/m    Physical Exam Vitals and nursing note reviewed.  Constitutional:      General: She is not in acute distress.    Appearance: Normal appearance. She is not toxic-appearing.  HENT:     Head: Normocephalic and atraumatic.  Cardiovascular:     Rate and Rhythm: Normal rate and regular rhythm. No extrasystoles are present.    Pulses: Normal pulses.     Heart sounds: Normal heart sounds. No murmur heard.    No friction rub. No gallop.  Pulmonary:     Effort: Pulmonary effort is normal. No respiratory distress.     Breath sounds: Normal breath sounds. No wheezing or rales.  Skin:    General: Skin is warm and dry.  Neurological:  Mental Status: She is alert and oriented to person, place, and time. Mental status is at baseline.  Psychiatric:        Mood and Affect: Mood normal.        Behavior: Behavior normal.        Thought Content: Thought content normal.        Judgment: Judgment normal.       Results:   Studies obtained and personally reviewed by me:   Labs:       Component Value Date/Time   NA 139 10/22/2023 0922   K 4.5 10/22/2023 0922   CL 104 10/22/2023 0922   CO2 26 10/22/2023 0922   GLUCOSE 116 (H) 10/22/2023 0922   BUN 17 10/22/2023 0922   CREATININE 0.63 10/22/2023 0922   CALCIUM  9.3 10/22/2023 0922   PROT 6.6 10/22/2023 0922   ALBUMIN 4.0 10/21/2016 0905   AST 19 10/22/2023 0922   ALT 22 10/22/2023 0922   ALKPHOS 59 10/21/2016 0905   BILITOT 0.7 10/22/2023 0922   GFRNONAA 85 07/17/2020 0923   GFRAA 98 07/17/2020 0923     Lab Results  Component Value Date   WBC 4.0 10/22/2023   HGB 13.1 10/22/2023   HCT 40.9  10/22/2023   MCV 96.5 10/22/2023   PLT 310 10/22/2023    Lab Results  Component Value Date   CHOL 173 10/22/2023   HDL 67 10/22/2023   LDLCALC 87 10/22/2023   TRIG 94 10/22/2023   CHOLHDL 2.6 10/22/2023    Lab Results  Component Value Date   HGBA1C 6.1 (H) 12/08/2023     Lab Results  Component Value Date   TSH 0.82 10/22/2023        Assessment & Plan:   Orders Placed This Encounter  Procedures   Hemoglobin A1c   Hepatic function panel   Lipid panel   Microalbumin / creatinine urine ratio   Meds ordered this encounter  Medications   levofloxacin  (LEVAQUIN ) 500 MG tablet    Sig: Take 1 tablet (500 mg total) by mouth daily for 7 days.    Dispense:  7 tablet    Refill:  0   glipiZIDE  (GLUCOTROL  XL) 2.5 MG 24 hr tablet    Sig: Take 2 tablets daily    Dispense:  180 tablet    Refill:  3   azithromycin  (ZITHROMAX ) 250 MG tablet    Sig: Take 2 tablets on day 1, then 1 tablet daily on days 2 through 5    Dispense:  6 tablet    Refill:  0    She is going on a trip to [place] in about a week and wants an antibiotic to have on hand  in case she gets sick overseas    Levaquin  500 mg daily prescribed.     Type 2 Diabetes associated Hyperlipidemia:   Hyperlipidemia is treated with atorvastatin  80 mg daily.    Lipid panel collected today   Diabetes is  treated with metformin  500 mg twice daily , glipizide  5 mg daily with breakfast.    Plan: HgbA1c drawn-Addendum: Hgb AIC is 6.2 % no change in therapy   Urine Microalbumiin collected  Glipizide  refilled. Total dose is 5 mg daily  Hypertension: treated with amlodipine  5 mg daily in the morning, losartan -hydrochlorothiazide 100-25 mg daily. Blood pressure is normal at 110/80  Anxiety: treated with Xanax  0.5 mg twice a day as needed     Osteopenia: treated with Boniva  150 mg every 30 days. Last bone  density study stable at -1.4 in femoral neck and July 2023. Need repeat study Summer 2025.   Vitamin D  deficiency: treated   with Vitamin D  orally  50,000 units weekly.  Level not checked since 2023. Was 36 at the time. Test is expensive. Continue taking same dose.     Carpal Tunnel syndrome: she is S/P a carpal tunnel release on her right hand performed Oct 4th 2024.    Labs pending. Will contact pt with results. Next appt is CPE in August.  Addendum: left voice mail with results of lab tests Labs are stab;e and she will follow up in 6 months.  I,Makayla C Reid,acting as a scribe for Ronal JINNY Hailstone, MD.,have documented all relevant documentation on the behalf of Ronal JINNY Hailstone, MD,as directed by  Ronal JINNY Hailstone, MD while in the presence of Ronal JINNY Hailstone, MD.   I, Ronal JINNY Hailstone, MD, have reviewed all documentation for this visit. The documentation on 05/12/2024 for the exam, diagnosis, procedures, and orders are all accurate and complete.    "

## 2024-05-10 ENCOUNTER — Other Ambulatory Visit: Payer: Self-pay

## 2024-05-12 ENCOUNTER — Encounter: Payer: Self-pay | Admitting: Internal Medicine

## 2024-05-12 ENCOUNTER — Ambulatory Visit: Payer: Self-pay | Admitting: Internal Medicine

## 2024-05-12 VITALS — BP 110/80 | HR 78 | Ht 62.0 in | Wt 156.0 lb

## 2024-05-12 DIAGNOSIS — Z7184 Encounter for health counseling related to travel: Secondary | ICD-10-CM

## 2024-05-12 DIAGNOSIS — F419 Anxiety disorder, unspecified: Secondary | ICD-10-CM | POA: Diagnosis not present

## 2024-05-12 DIAGNOSIS — Z7984 Long term (current) use of oral hypoglycemic drugs: Secondary | ICD-10-CM

## 2024-05-12 DIAGNOSIS — G5601 Carpal tunnel syndrome, right upper limb: Secondary | ICD-10-CM | POA: Diagnosis not present

## 2024-05-12 DIAGNOSIS — E1169 Type 2 diabetes mellitus with other specified complication: Secondary | ICD-10-CM | POA: Diagnosis not present

## 2024-05-12 DIAGNOSIS — I10 Essential (primary) hypertension: Secondary | ICD-10-CM

## 2024-05-12 DIAGNOSIS — E119 Type 2 diabetes mellitus without complications: Secondary | ICD-10-CM

## 2024-05-12 DIAGNOSIS — E559 Vitamin D deficiency, unspecified: Secondary | ICD-10-CM | POA: Diagnosis not present

## 2024-05-12 DIAGNOSIS — M8580 Other specified disorders of bone density and structure, unspecified site: Secondary | ICD-10-CM | POA: Diagnosis not present

## 2024-05-12 DIAGNOSIS — E785 Hyperlipidemia, unspecified: Secondary | ICD-10-CM

## 2024-05-12 DIAGNOSIS — Z780 Asymptomatic menopausal state: Secondary | ICD-10-CM

## 2024-05-12 MED ORDER — LEVOFLOXACIN 500 MG PO TABS: 500.0000 mg | ORAL_TABLET | Freq: Every day | ORAL | 0 refills | Status: AC

## 2024-05-12 MED ORDER — GLIPIZIDE ER 2.5 MG PO TB24: ORAL_TABLET | ORAL | 3 refills | Status: AC

## 2024-05-12 MED ORDER — AZITHROMYCIN 250 MG PO TABS: ORAL_TABLET | ORAL | 0 refills | Status: AC

## 2024-05-12 NOTE — Patient Instructions (Signed)
 Labs drawn and results pending. Travel advice encounter. Antibiotic prescribed for travel. CPE scheduled for August.

## 2024-05-13 ENCOUNTER — Other Ambulatory Visit: Payer: Self-pay

## 2024-05-13 LAB — HEMOGLOBIN A1C
Hgb A1c MFr Bld: 5.8 % — ABNORMAL HIGH
Mean Plasma Glucose: 120 mg/dL
eAG (mmol/L): 6.6 mmol/L

## 2024-05-13 LAB — HEPATIC FUNCTION PANEL
AG Ratio: 1.8 (calc) (ref 1.0–2.5)
ALT: 15 U/L (ref 6–29)
AST: 18 U/L (ref 10–35)
Albumin: 4.1 g/dL (ref 3.6–5.1)
Alkaline phosphatase (APISO): 62 U/L (ref 37–153)
Bilirubin, Direct: 0.2 mg/dL (ref 0.0–0.2)
Globulin: 2.3 g/dL (ref 1.9–3.7)
Indirect Bilirubin: 0.7 mg/dL (ref 0.2–1.2)
Total Bilirubin: 0.9 mg/dL (ref 0.2–1.2)
Total Protein: 6.4 g/dL (ref 6.1–8.1)

## 2024-05-13 LAB — LIPID PANEL
Cholesterol: 139 mg/dL
HDL: 67 mg/dL
LDL Cholesterol (Calc): 58 mg/dL
Non-HDL Cholesterol (Calc): 72 mg/dL
Total CHOL/HDL Ratio: 2.1 (calc)
Triglycerides: 59 mg/dL

## 2024-05-13 LAB — MICROALBUMIN / CREATININE URINE RATIO
Creatinine, Urine: 104 mg/dL (ref 20–275)
Microalb Creat Ratio: 8 mg/g{creat}
Microalb, Ur: 0.8 mg/dL

## 2024-06-06 ENCOUNTER — Ambulatory Visit

## 2024-11-22 ENCOUNTER — Other Ambulatory Visit

## 2024-11-24 ENCOUNTER — Ambulatory Visit: Admitting: Internal Medicine
